# Patient Record
Sex: Male | Born: 1970 | Race: White | Hispanic: No | Marital: Married | Smoking: Current every day smoker
Health system: Southern US, Community
[De-identification: ages and names within clinical notes are randomized; demographics above are authoritative.]

## PROBLEM LIST (undated history)

## (undated) ENCOUNTER — Emergency Department: Discharge status: Absent without leave | Payer: Self-pay | Source: Home / Self Care

## (undated) DIAGNOSIS — E785 Hyperlipidemia, unspecified: Secondary | ICD-10-CM

## (undated) DIAGNOSIS — F419 Anxiety disorder, unspecified: Secondary | ICD-10-CM

## (undated) DIAGNOSIS — K2960 Other gastritis without bleeding: Secondary | ICD-10-CM

## (undated) DIAGNOSIS — G4700 Insomnia, unspecified: Secondary | ICD-10-CM

## (undated) HISTORY — PX: BACK SURGERY: SHX140

---

## 2011-05-05 ENCOUNTER — Emergency Department
Admission: EM | Admit: 2011-05-05 | Discharge: 2011-05-05 | Disposition: A | Discharge status: Home / Self Care | Payer: BC Managed Care – PPO | Source: Home / Self Care | Attending: Family Medicine | Admitting: Family Medicine

## 2011-05-05 DIAGNOSIS — J069 Acute upper respiratory infection, unspecified: Secondary | ICD-10-CM

## 2011-05-05 HISTORY — DX: Anxiety disorder, unspecified: F41.9

## 2011-05-05 HISTORY — DX: Hyperlipidemia, unspecified: E78.5

## 2011-05-05 MED ORDER — BENZONATATE 200 MG PO CAPS: 200.0000 mg | ORAL_CAPSULE | Freq: Every day | ORAL | Status: AC

## 2011-05-05 MED ORDER — CEFDINIR 300 MG PO CAPS: 300.0000 mg | ORAL_CAPSULE | Freq: Two times a day (BID) | ORAL | Status: AC

## 2011-05-05 NOTE — ED Provider Notes (Signed)
History     CSN: [id number] Arrival date & time: 05/05/2011  5:09 PM   First MD Initiated Contact with Patient 05/05/11 1723      Chief Complaint  Patient presents with  . Cough      HPI Comments: Patient complains of URI symptoms that started 3 days ago, now becoming worse with wheezing.  He smokes 1.5 pack per day   Patient is a 40 y.o. male presenting with cough.  Cough The current episode started more than 2 days ago. The problem occurs hourly. The problem has been gradually worsening. The cough is non-productive. There has been no fever. Associated symptoms include ear congestion, ear pain, rhinorrhea, sore throat and wheezing. Pertinent negatives include no chest pain, no chills, no sweats, no headaches, no myalgias, no shortness of breath and no eye redness. He has tried nothing for the symptoms. He is a smoker. His past medical history is significant for pneumonia.    Past Medical History  Diagnosis Date  . Anxiety   . Hyperlipidemia     History reviewed. No pertinent past surgical history.  History reviewed. No pertinent family history.  History  Substance Use Topics  . Smoking status: Current Everyday Smoker  . Smokeless tobacco: Not on file  . Alcohol Use: No      Review of Systems  Constitutional: Positive for fatigue. Negative for fever and chills.  HENT: Positive for ear pain, congestion, sore throat, rhinorrhea and voice change. Negative for trouble swallowing.   Eyes: Negative for redness.  Respiratory: Positive for cough and wheezing. Negative for chest tightness and shortness of breath.   Cardiovascular: Negative for chest pain.  Gastrointestinal: Negative.   Genitourinary: Negative.   Musculoskeletal: Negative.  Negative for myalgias.  Skin: Negative.   Neurological: Negative for headaches.    Allergies  Review of patient's allergies indicates no known allergies.  Home Medications   Current Outpatient Rx  Name Route Sig Dispense Refill  .  ALPRAZOLAM 1 MG PO TABS Oral Take 1 mg by mouth at bedtime as needed.      Marland Kitchen SIMVASTATIN 40 MG PO TABS Oral Take 40 mg by mouth at bedtime.      Marland Kitchen BENZONATATE 200 MG PO CAPS Oral Take 1 capsule (200 mg total) by mouth at bedtime. Take as needed for cough 12 capsule 0  . CEFDINIR 300 MG PO CAPS Oral Take 1 capsule (300 mg total) by mouth 2 (two) times daily. 20 capsule 0    BP 119/78  Pulse 72  Temp(Src) 98.3 F (36.8 C) (Oral)  Resp 18  Ht 5\' 10"  (1.778 m)  Wt 191 lb 8 oz (86.864 kg)  BMI 27.48 kg/m2  SpO2 98%  Physical Exam  Nursing note and vitals reviewed. Constitutional: He is oriented to person, place, and time. He appears well-developed and well-nourished. No distress.  HENT:  Head: Normocephalic and atraumatic.  Right Ear: Tympanic membrane and external ear normal.  Left Ear: Tympanic membrane and external ear normal.  Nose: Nose normal.  Mouth/Throat: Oropharynx is clear and moist. No oropharyngeal exudate.  Eyes: Conjunctivae are normal. Pupils are equal, round, and reactive to light. Right eye exhibits no discharge. Left eye exhibits no discharge. No scleral icterus.  Neck: Neck supple.  Cardiovascular: Normal rate, regular rhythm and normal heart sounds.   Pulmonary/Chest: No respiratory distress. He has no wheezes. He has rhonchi. He has no rales. He exhibits no tenderness.  Abdominal: Soft. There is no tenderness.  Musculoskeletal: He exhibits  no edema and no tenderness.  Lymphadenopathy:    He has no cervical adenopathy.  Neurological: He is alert and oriented to person, place, and time.  Skin: Skin is warm and dry.    ED Course  Procedures none      1. Acute upper respiratory infections of unspecified site       MDM  With history of pneumonia, will begin Omnicef and Tessalon at bedtime. Take Mucinex D (guaifenesin with decongestant) twice daily for congestion.  Increase fluid intake, rest. May use Afrin nasal spray (or generic oxymetazoline) twice  daily for about 5 days.  Also recommend using saline nasal spray several times daily and/or saline nasal irrigation. Stop all antihistamines for now, and other non-prescription cough/cold preparations. May take Ibuprofen 200mg , 4 tabs every 8 hours with food for body aches, etc. Follow-up with family doctor if not improving one week.         Donna Christen, MD 05/07/11 2241

## 2011-05-05 NOTE — ED Notes (Signed)
Dry cough, tightness in chest started on Friday

## 2011-09-17 ENCOUNTER — Encounter: Payer: Self-pay | Admitting: Emergency Medicine

## 2011-09-17 ENCOUNTER — Emergency Department
Admission: EM | Admit: 2011-09-17 | Discharge: 2011-09-17 | Disposition: A | Discharge status: Home / Self Care | Payer: BC Managed Care – PPO | Source: Home / Self Care | Attending: Emergency Medicine | Admitting: Emergency Medicine

## 2011-09-17 DIAGNOSIS — J069 Acute upper respiratory infection, unspecified: Secondary | ICD-10-CM

## 2011-09-17 MED ORDER — TRAMADOL HCL 50 MG PO TABS: 50.0000 mg | ORAL_TABLET | Freq: Four times a day (QID) | ORAL | Status: AC | PRN

## 2011-09-17 MED ORDER — AMOXICILLIN-POT CLAVULANATE 875-125 MG PO TABS: 1.0000 | ORAL_TABLET | Freq: Two times a day (BID) | ORAL | Status: AC

## 2011-09-17 MED ORDER — METHYLPREDNISOLONE SODIUM SUCC 125 MG IJ SOLR
125.0000 mg | Freq: Once | INTRAMUSCULAR | Status: AC
  Administered 2011-09-17: 125 mg via INTRAMUSCULAR

## 2011-09-17 NOTE — ED Notes (Signed)
Sinus headache, pain behind the eyes, ear pain x 3 weeks

## 2011-09-17 NOTE — ED Provider Notes (Signed)
History     CSN: [id number]  Arrival date & time 09/17/11  1514   First MD Initiated Contact with Patient 09/17/11 1518      Chief Complaint  Patient presents with  . Sinusitis    (Consider location/radiation/quality/duration/timing/severity/associated sxs/prior treatment) HPI Ronald Chavez is a 41 y.o. male who complains of onset of cold symptoms for 3 weeks.  The symptoms are constant and mild-moderate in severity.  No known seasonal allergies.  He has been taking OTC sinus and cold medicines which don't seem to be helping. + sore throat + cough No pleuritic pain No wheezing + nasal congestion + post-nasal drainage ++ sinus pain/pressure No chest congestion No itchy/red eyes + earache No hemoptysis No SOB No chills/sweats No fever No nausea No vomiting No abdominal pain No diarrhea No skin rashes No fatigue No myalgias + frontal headache    Past Medical History  Diagnosis Date  . Anxiety   . Hyperlipidemia   . Hyperlipidemia     History reviewed. No pertinent past surgical history.  Family History  Problem Relation Age of Onset  . Rheum arthritis Father     History  Substance Use Topics  . Smoking status: Current Everyday Smoker -- 25 years  . Smokeless tobacco: Not on file  . Alcohol Use: No      Review of Systems  All other systems reviewed and are negative.    Allergies  Review of patient's allergies indicates not on file.  Home Medications   Current Outpatient Rx  Name Route Sig Dispense Refill  . ALPRAZOLAM 1 MG PO TABS Oral Take 1 mg by mouth at bedtime as needed.      . AMOXICILLIN-POT CLAVULANATE 875-125 MG PO TABS Oral Take 1 tablet by mouth 2 (two) times daily. 20 tablet 0  . SIMVASTATIN 40 MG PO TABS Oral Take 40 mg by mouth at bedtime.      . TRAMADOL HCL 50 MG PO TABS Oral Take 1 tablet (50 mg total) by mouth every 6 (six) hours as needed for pain. 20 tablet 0    BP 127/77  Pulse 84  Temp(Src) 98.7 F (37.1 C) (Oral)  Resp  16  Ht 5\' 9"  (1.753 m)  Wt 189 lb (85.73 kg)  BMI 27.91 kg/m2  SpO2 97%  Physical Exam  Nursing note and vitals reviewed. Constitutional: He is oriented to person, place, and time. He appears well-developed and well-nourished.  HENT:  Head: Normocephalic and atraumatic.  Right Ear: Tympanic membrane, external ear and ear canal normal.  Left Ear: Tympanic membrane, external ear and ear canal normal.  Nose: Mucosal edema (raw) and rhinorrhea present. Right sinus exhibits frontal sinus tenderness. Left sinus exhibits frontal sinus tenderness.  Mouth/Throat: Posterior oropharyngeal edema (tonsillar swelling 2+ bilateral) and posterior oropharyngeal erythema present. No oropharyngeal exudate.  Eyes: No scleral icterus.  Neck: Neck supple.  Cardiovascular: Regular rhythm and normal heart sounds.   Pulmonary/Chest: Effort normal and breath sounds normal. No respiratory distress. He has no decreased breath sounds. He has no wheezes. He has no rhonchi.  Neurological: He is alert and oriented to person, place, and time.  Skin: Skin is warm and dry. No rash noted.  Psychiatric: He has a normal mood and affect. His speech is normal.    ED Course  Procedures (including critical care time)  Labs Reviewed - No data to display No results found.   1. Acute upper respiratory infections of unspecified site       MDM  1)  Take the prescribed antibiotic as instructed.  He declined Prednisone, but asked for shot [of Solumedrol].  Next step would be to send to ENT vs start on Flonase.  2)  Use nasal saline solution (over the counter) at least 3 times a day. 3)  Use over the counter decongestants like Zyrtec-D every 12 hours as needed to help with congestion.  If you have hypertension, do not take medicines with sudafed.  He reports that he doesn't want to take Sudafed because it keeps him up at night. 4)  Can take tylenol every 6 hours or motrin every 8 hours for pain or fever. 5)  Follow up with  your primary doctor if no improvement in 5-7 days, sooner if increasing pain, fever, or new symptoms.     Marlaine Hind, MD 09/17/11 1550

## 2012-01-21 ENCOUNTER — Emergency Department
Admission: EM | Admit: 2012-01-21 | Discharge: 2012-01-21 | Disposition: A | Discharge status: Home / Self Care | Payer: BC Managed Care – PPO | Source: Home / Self Care | Attending: Family Medicine | Admitting: Family Medicine

## 2012-01-21 ENCOUNTER — Encounter: Payer: Self-pay | Admitting: *Deleted

## 2012-01-21 DIAGNOSIS — J209 Acute bronchitis, unspecified: Secondary | ICD-10-CM

## 2012-01-21 MED ORDER — HYDROCOD POLST-CHLORPHEN POLST 10-8 MG/5ML PO LQCR: 5.0000 mL | Freq: Every evening | ORAL | Status: DC | PRN

## 2012-01-21 MED ORDER — PSEUDOEPHEDRINE-GUAIFENESIN ER 60-600 MG PO TB12: 1.0000 | ORAL_TABLET | Freq: Two times a day (BID) | ORAL | Status: DC

## 2012-01-21 MED ORDER — CEFDINIR 300 MG PO CAPS: 300.0000 mg | ORAL_CAPSULE | Freq: Two times a day (BID) | ORAL | Status: AC

## 2012-01-21 NOTE — ED Provider Notes (Signed)
History     CSN: [id number]  Arrival date & time 01/21/12  1514   First MD Initiated Contact with Patient 01/21/12 1534      Chief Complaint  Patient presents with  . Nasal Congestion  . Otalgia      HPI Comments: Patient complains of approximately 2.5 week history of gradually progressive URI symptoms beginning with a mild sore throat (now persistent), followed by progressive nasal congestion.  A cough started next and has persisted.  Complains of fatigue and initial myalgias.  Cough is now worse at night and generally non-productive during the day.  There has been no pleuritic pain, but he does have shortness of breath and wheezing with activity.  He notes that both ears feel clogged.  He has had pneumonia in the past. He continues to smoke.  The history is provided by the patient.    Past Medical History  Diagnosis Date  . Anxiety   . Hyperlipidemia   . Hyperlipidemia     History reviewed. No pertinent past surgical history.  Family History  Problem Relation Age of Onset  . Rheum arthritis Father     History  Substance Use Topics  . Smoking status: Current Everyday Smoker -- 25 years  . Smokeless tobacco: Not on file  . Alcohol Use: No      Review of Systems + sore throat + cough No pleuritic pain + wheezing + nasal congestion + post-nasal drainage ? sinus pain/pressure No itchy/red eyes ? earache No hemoptysis + SOB with activity No fever, + chills No nausea No vomiting No abdominal pain No diarrhea No urinary symptoms No skin rashes + fatigue No myalgias No headache Used OTC meds without relief  Allergies  Review of patient's allergies indicates no known allergies.  Home Medications   Current Outpatient Rx  Name Route Sig Dispense Refill  . ATORVASTATIN CALCIUM 40 MG PO TABS Oral Take 40 mg by mouth daily.    Marland Kitchen ALPRAZOLAM 1 MG PO TABS Oral Take 1 mg by mouth at bedtime as needed.      Marland Kitchen CEFDINIR 300 MG PO CAPS Oral Take 1 capsule (300  mg total) by mouth 2 (two) times daily. 20 capsule 0  . HYDROCOD POLST-CPM POLST ER 10-8 MG/5ML PO LQCR Oral Take 5 mLs by mouth at bedtime as needed. 115 mL 0  . PSEUDOEPHEDRINE-GUAIFENESIN ER 60-600 MG PO TB12 Oral Take 1 tablet by mouth every 12 (twelve) hours. For cough and sinus congestion 20 tablet 0  . SIMVASTATIN 40 MG PO TABS Oral Take 40 mg by mouth at bedtime.        BP 128/83  Pulse 81  Temp 98.3 F (36.8 C) (Oral)  Resp 16  Ht 5\' 10"  (1.778 m)  Wt 185 lb (83.915 kg)  BMI 26.54 kg/m2  SpO2 98%  Physical Exam Nursing notes and Vital Signs reviewed. Appearance:  Patient appears healthy, stated age, and in no acute distress Eyes:  Pupils are equal, round, and reactive to light and accomodation.  Extraocular movement is intact.  Conjunctivae are not inflamed  Ears:  Canals normal.  Tympanic membranes normal.  Nose:  Mildly congested turbinates.  No sinus tenderness.   Pharynx:  Normal Neck:  Supple.  No adenopathy Lungs:  Clear to auscultation.  Breath sounds are equal.  Heart:  Regular rate and rhythm without murmurs, rubs, or gallops.  Abdomen:  Nontender without masses or hepatosplenomegaly.  Bowel sounds are present.  No CVA or flank tenderness.  Extremities:  No edema.  No calf tenderness Skin:  No rash present.   ED Course  Procedures none      1. Acute bronchitis       MDM  Begin Omnicef.  Mucinex D.  Rx for Tussionex at bedtime. May use Afrin nasal spray (or generic oxymetazoline) twice daily for about 5 days.  Also recommend using saline nasal spray several times daily and saline nasal irrigation (AYR is a common brand) Stop all antihistamines for now, and other non-prescription cough/cold preparations. Follow-up with family doctor if not improving 7 to 10 days.  Urged to discontinue smoking.        Lattie Haw, MD 01/21/12 [phone]

## 2012-01-21 NOTE — ED Notes (Signed)
Pt c/o nasal congestion, bilateral ear ache, and productive cough x 2 wks. Pt c/o fever at onset of s/s. Pt reports that he took tylenol 4 tabs 1 hour ago.

## 2012-02-05 ENCOUNTER — Ambulatory Visit (INDEPENDENT_AMBULATORY_CARE_PROVIDER_SITE_OTHER): Payer: BC Managed Care – PPO | Admitting: Psychology

## 2012-02-05 ENCOUNTER — Encounter (HOSPITAL_COMMUNITY): Payer: Self-pay | Admitting: Psychology

## 2012-02-05 DIAGNOSIS — F329 Major depressive disorder, single episode, unspecified: Secondary | ICD-10-CM

## 2012-02-05 DIAGNOSIS — F411 Generalized anxiety disorder: Secondary | ICD-10-CM

## 2012-02-05 DIAGNOSIS — F32A Depression, unspecified: Secondary | ICD-10-CM | POA: Insufficient documentation

## 2012-02-05 DIAGNOSIS — F3289 Other specified depressive episodes: Secondary | ICD-10-CM
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
## 2012-02-05 NOTE — Patient Instructions (Signed)
1- Ask family doctor to complete FMLA paperwork. If he will not complete bring paperwork next visit with Olegario Messier. 2- Tell your boss that you have a medical appointment on Monday, Sept 23rd at 2 pm and ask to leave work by 145pm to get to your appointment.  If you need assistance to get the time off ask for help with HR. 3- Come prepared to discuss goals for therapy.  If the FMLA paperwork was not completed by your primary care doctor bring it with you and we will complete in session.

## 2012-02-09 ENCOUNTER — Ambulatory Visit (INDEPENDENT_AMBULATORY_CARE_PROVIDER_SITE_OTHER): Payer: BC Managed Care – PPO | Admitting: Psychology

## 2012-02-09 ENCOUNTER — Encounter (HOSPITAL_COMMUNITY): Payer: Self-pay | Admitting: Psychology

## 2012-02-09 DIAGNOSIS — F3289 Other specified depressive episodes: Secondary | ICD-10-CM

## 2012-02-09 DIAGNOSIS — F411 Generalized anxiety disorder: Secondary | ICD-10-CM

## 2012-02-09 DIAGNOSIS — F32A Depression, unspecified: Secondary | ICD-10-CM

## 2012-02-09 DIAGNOSIS — F329 Major depressive disorder, single episode, unspecified: Secondary | ICD-10-CM

## 2012-02-09 NOTE — Progress Notes (Signed)
   THERAPIST PROGRESS NOTE  Session Time: 212- 248 pm  Participation Level: Active  Behavioral Response: CasualAlertEuthymic  Type of Therapy: Individual Therapy  Treatment Goals addressed: Anxiety and Coping  Interventions: Solution Focused, Strength-based, Psychosocial Skills: goal setting, coping and Supportive  Summary: Ronald Chavez is a 41 y.o. male who presents as pleasant and easily engaged.  His attempt to get his FMLA paperwork completed by his MD did not work and he brings the paperwork back in for me to complete.  The patient reports he had an okay week.  He talked about some of the struggles he has with watching some coworkers get away with doing less than him and how this frustrates him.  He has been able to make it to work most days in the past week.  This counselor and the patient completed goals for treatment.  The FMLA paperwork was completed during this visit as well.  Suicidal/Homicidal: No  Plan: Return again in 1 week.  Diagnosis: Axis I: Depressive Disorder NOS and Generalized Anxiety Disorder    Axis II: No diagnosis    Salley Scarlet, [facility] 02/09/2012

## 2012-02-18 ENCOUNTER — Ambulatory Visit (INDEPENDENT_AMBULATORY_CARE_PROVIDER_SITE_OTHER): Payer: BC Managed Care – PPO | Admitting: Psychology

## 2012-02-18 DIAGNOSIS — F411 Generalized anxiety disorder: Secondary | ICD-10-CM

## 2012-02-18 DIAGNOSIS — F3289 Other specified depressive episodes: Secondary | ICD-10-CM

## 2012-02-18 DIAGNOSIS — F32A Depression, unspecified: Secondary | ICD-10-CM

## 2012-02-18 DIAGNOSIS — F329 Major depressive disorder, single episode, unspecified: Secondary | ICD-10-CM

## 2012-02-24 ENCOUNTER — Ambulatory Visit (HOSPITAL_COMMUNITY): Payer: Self-pay | Admitting: Psychology

## 2012-02-29 ENCOUNTER — Encounter (HOSPITAL_COMMUNITY): Payer: Self-pay | Admitting: Psychology

## 2012-02-29 NOTE — Progress Notes (Signed)
   THERAPIST PROGRESS NOTE  Session Time: 314- 350 pm  Participation Level: Active  Behavioral Response: NeatAlertEuthymic  Type of Therapy: Individual Therapy  Treatment Goals addressed: Anxiety, Communication: in relationships and Coping  Interventions: Solution Focused, Strength-based, Psychosocial Skills: coping and Supportive  Summary: Ronald Chavez is a 41 y.o. male who presents late for his appointment (he called to inform); he is pleasant and easily engaged.  He is doing better than our previous visit.  He assisted a co-worker with whom he is easily annoyed when he completed his work.  He felt good about having a positive interaction with him and can see how this will be helpful going forward.  He has not missed any work since handing in his FMLA paperwork but has the ability to be away from work without an issue since this occurred.  He is feeling stressed by moving to a new place but recognizes this as a good stress.  He is excited that his girlfriend will be living with he and his son but admits that moving is stressful.  He has had no interactions with his estranged wife and doesn't even know her whereabouts.   Suicidal/Homicidal: No  Plan: Return again in 2-3 weeks.  Diagnosis: Axis I: Depressive Disorder NOS and Generalized Anxiety Disorder    Axis II: No diagnosis    Salley Scarlet, [facility] 02/29/2012

## 2012-03-03 ENCOUNTER — Ambulatory Visit (HOSPITAL_COMMUNITY): Payer: Self-pay | Admitting: Psychology

## 2012-03-16 ENCOUNTER — Ambulatory Visit (HOSPITAL_COMMUNITY): Payer: Self-pay | Admitting: Psychology

## 2012-08-08 ENCOUNTER — Emergency Department (INDEPENDENT_AMBULATORY_CARE_PROVIDER_SITE_OTHER): Payer: BC Managed Care – PPO

## 2012-08-08 ENCOUNTER — Emergency Department (INDEPENDENT_AMBULATORY_CARE_PROVIDER_SITE_OTHER)
Admission: EM | Admit: 2012-08-08 | Discharge: 2012-08-08 | Disposition: A | Discharge status: Home / Self Care | Payer: BC Managed Care – PPO | Source: Home / Self Care | Attending: Emergency Medicine | Admitting: Emergency Medicine

## 2012-08-08 DIAGNOSIS — M25519 Pain in unspecified shoulder: Secondary | ICD-10-CM

## 2012-08-08 DIAGNOSIS — S4980XA Other specified injuries of shoulder and upper arm, unspecified arm, initial encounter: Secondary | ICD-10-CM

## 2012-08-08 DIAGNOSIS — M25512 Pain in left shoulder: Secondary | ICD-10-CM

## 2012-08-08 MED ORDER — HYDROCODONE-ACETAMINOPHEN 5-325 MG PO TABS: 2.0000 | ORAL_TABLET | Freq: Four times a day (QID) | ORAL | Status: DC | PRN
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
## 2012-08-09 ENCOUNTER — Telehealth: Payer: Self-pay | Admitting: Emergency Medicine

## 2013-12-18 ENCOUNTER — Emergency Department (INDEPENDENT_AMBULATORY_CARE_PROVIDER_SITE_OTHER)
Admission: EM | Admit: 2013-12-18 | Discharge: 2013-12-18 | Disposition: A | Discharge status: Home / Self Care | Payer: BC Managed Care – PPO | Source: Home / Self Care | Attending: Family Medicine | Admitting: Family Medicine

## 2013-12-18 ENCOUNTER — Encounter: Payer: Self-pay | Admitting: Emergency Medicine

## 2013-12-18 ENCOUNTER — Emergency Department (INDEPENDENT_AMBULATORY_CARE_PROVIDER_SITE_OTHER): Payer: BC Managed Care – PPO

## 2013-12-18 DIAGNOSIS — Z87891 Personal history of nicotine dependence: Secondary | ICD-10-CM

## 2013-12-18 DIAGNOSIS — J209 Acute bronchitis, unspecified: Secondary | ICD-10-CM

## 2013-12-18 DIAGNOSIS — R059 Cough, unspecified: Secondary | ICD-10-CM

## 2013-12-18 DIAGNOSIS — R05 Cough: Secondary | ICD-10-CM

## 2013-12-18 HISTORY — DX: Other gastritis without bleeding: K29.60

## 2013-12-18 LAB — POCT CBC W AUTO DIFF (K'VILLE URGENT CARE)

## 2013-12-18 MED ORDER — FLUTICASONE-SALMETEROL 100-50 MCG/DOSE IN AEPB: 1.0000 | INHALATION_SPRAY | Freq: Two times a day (BID) | RESPIRATORY_TRACT | Status: DC

## 2013-12-18 MED ORDER — GUAIFENESIN ER 600 MG PO TB12: ORAL_TABLET | ORAL | Status: DC

## 2013-12-18 MED ORDER — IPRATROPIUM-ALBUTEROL 0.5-2.5 (3) MG/3ML IN SOLN
3.0000 mL | Freq: Once | RESPIRATORY_TRACT | Status: AC
  Administered 2013-12-18: 3 mL via RESPIRATORY_TRACT

## 2013-12-18 MED ORDER — ALBUTEROL SULFATE HFA 108 (90 BASE) MCG/ACT IN AERS: 2.0000 | INHALATION_SPRAY | RESPIRATORY_TRACT | Status: DC | PRN

## 2013-12-18 MED ORDER — BENZONATATE 200 MG PO CAPS
200.0000 mg | ORAL_CAPSULE | Freq: Every day | ORAL | Status: DC
Start: 2013-12-18 — End: 2014-06-13

## 2013-12-18 MED ORDER — IBUPROFEN 800 MG PO TABS: 800.0000 mg | ORAL_TABLET | Freq: Three times a day (TID) | ORAL | Status: DC

## 2013-12-18 MED ORDER — AZITHROMYCIN 250 MG PO TABS: ORAL_TABLET | ORAL | Status: DC

## 2013-12-18 NOTE — ED Provider Notes (Signed)
CSN: [id number]     Arrival date & time 12/18/13  1150 History   First MD Initiated Contact with Patient 12/18/13 1235     Chief Complaint  Patient presents with  . Shortness of Breath      HPI Comments: Patient complains of onset of cough and nasal congestion about one week ago.  The cough is non-productive and worse at night.  He has had shortness of breath and wheezing.  Over the past 2 to 3 days he has had chills. He smokes 1 pack per day and had pneumonia in 2009  The history is provided by the patient.    Past Medical History  Diagnosis Date  . Anxiety   . Hyperlipidemia   . Hyperlipidemia   . Reflux gastritis    Past Surgical History  Procedure Laterality Date  . Back surgery     Family History  Problem Relation Age of Onset  . Rheum arthritis Father    History  Substance Use Topics  . Smoking status: Current Every Day Smoker -- 1.00 packs/day for 25 years    Types: Cigarettes  . Smokeless tobacco: Not on file  . Alcohol Use: No    Review of Systems No sore throat + cough No pleuritic pain + wheezing + nasal congestion + post-nasal drainage No sinus pain/pressure No itchy/red eyes ? earache No hemoptysis + SOB No fever, + chills No nausea No vomiting No abdominal pain No diarrhea No urinary symptoms No skin rash + fatigue No myalgias + headache Used OTC meds without relief  Allergies  Review of patient's allergies indicates no known allergies.  Home Medications   Prior to Admission medications   Medication Sig Start Date End Date Taking? Authorizing Provider  albuterol (PROVENTIL HFA;VENTOLIN HFA) 108 (90 BASE) MCG/ACT inhaler Inhale 2 puffs into the lungs every 4 (four) hours as needed. 12/18/13   Lattie Haw, MD  ALPRAZolam Prudy Feeler) 1 MG tablet Take 1 mg by mouth 3 (three) times daily as needed.     Historical Provider, MD  atorvastatin (LIPITOR) 40 MG tablet Take 40 mg by mouth daily.    Historical Provider, MD  azithromycin (ZITHROMAX  Z-PAK) 250 MG tablet Take 2 tabs today; then begin one tab once daily for 4 more days. 12/18/13   Lattie Haw, MD  benzonatate (TESSALON) 200 MG capsule Take 1 capsule (200 mg total) by mouth at bedtime. Take as needed for cough 12/18/13   Lattie Haw, MD  Fluticasone-Salmeterol (ADVAIR DISKUS) 100-50 MCG/DOSE AEPB Inhale 1 puff into the lungs 2 (two) times daily. 12/18/13   Lattie Haw, MD  sertraline (ZOLOFT) 50 MG tablet Take 50 mg by mouth daily.    Historical Provider, MD   BP 122/80  Pulse 110  Temp(Src) 99.7 F (37.6 C) (Oral)  Ht 5\' 10"  (1.778 m)  Wt 200 lb 8 oz (90.946 kg)  BMI 28.77 kg/m2 Physical Exam Nursing notes and Vital Signs reviewed. Appearance:  Patient appears healthy, stated age, and in no acute distress Eyes:  Pupils are equal, round, and reactive to light and accomodation.  Extraocular movement is intact.  Conjunctivae are not inflamed  Ears:  Canals normal.  Tympanic membranes normal.  Nose:  Mildly congested turbinates.  No sinus tenderness.   Pharynx:  Normal Neck:  Supple.   Enlarged posterior nodes are palpated bilaterally  Lungs:  Bilateral wheezes, more prominent on the left. Breath sounds are equal.  Chest:  Distinct tenderness to palpation over the  mid-sternum.  Heart:  Regular rate and rhythm without murmurs, rubs, or gallops.  Rate 104 Abdomen:  Nontender without masses or hepatosplenomegaly.  Bowel sounds are present.  No CVA or flank tenderness.  Extremities:  No edema.  No calf tenderness Skin:  No rash present.   ED Course  Procedures  none    Labs Reviewed  POCT CBC W AUTO DIFF ([facility]):  WBC 10.0; LY 10.9; MO 2.9; GR 86.2; Hgb 14.7; Platelets 158     Imaging Review Dg Chest 2 View  12/18/2013   CLINICAL DATA:  Cough, smoker  EXAM: CHEST  2 VIEW  COMPARISON:  None.  FINDINGS: The heart size and mediastinal contours are within normal limits. Both lungs are clear. The visualized skeletal structures are unremarkable. The hila  are prominent bilaterally with tapering that may be seen with pulmonary arterial hypertension but is nonspecific. Diffusely prominent interstitial reticular opacities are noted.  IMPRESSION: Nonspecific prominent interstitial opacities which may be seen with the history of smoking but is nonspecific.   Electronically Signed   By: Christiana PellantGretchen  Green M.D.   On: 12/18/2013 13:25     MDM   1. Bronchospasm with bronchitis, acute    Administered DuoNeb by nebulizer, with improvement in symptoms. Begin Z-pack for atypical coverage.  Begin Advair and albuterol for rescue. Prescription written for Benzonatate [facility](Tessalon) to take at bedtime for night-time cough.  Take plain Mucinex (1200 mg guaifenesin) twice daily for cough and congestion.  May add Sudafed for sinus congestion.   Increase fluid intake, rest. Stop all antihistamines for now, and other non-prescription cough/cold preparations. May take Ibuprofen 200mg , 4 tabs every 8 hours with food for body aches, fever, etc.   Follow-up with family doctor if not improving 7 to 10 days.     Lattie HawStephen A Pietro Bonura, MD 12/21/13 1318

## 2013-12-18 NOTE — ED Notes (Signed)
Cough began 1 wk ago and has worsened.  Non productive. Puffy in periorbital regions ou.  Cannot breathe well when lying down.

## 2013-12-18 NOTE — Discharge Instructions (Signed)
Take plain Mucinex (1200 mg guaifenesin) twice daily for cough and congestion.  May add Sudafed for sinus congestion.   Increase fluid intake, rest. Stop all antihistamines for now, and other non-prescription cough/cold preparations. May take Ibuprofen 200mg , 4 tabs every 8 hours with food for body aches, fever, etc.   Follow-up with family doctor if not improving 7 to 10 days.

## 2014-05-23 ENCOUNTER — Emergency Department
Admission: EM | Admit: 2014-05-23 | Discharge: 2014-05-23 | Disposition: A | Discharge status: Home / Self Care | Payer: Self-pay | Source: Home / Self Care | Attending: Emergency Medicine | Admitting: Emergency Medicine

## 2014-05-23 ENCOUNTER — Encounter: Payer: Self-pay | Admitting: *Deleted

## 2014-05-23 ENCOUNTER — Emergency Department (INDEPENDENT_AMBULATORY_CARE_PROVIDER_SITE_OTHER): Payer: PRIVATE HEALTH INSURANCE

## 2014-05-23 DIAGNOSIS — S60012A Contusion of left thumb without damage to nail, initial encounter: Secondary | ICD-10-CM | POA: Diagnosis not present

## 2014-05-23 DIAGNOSIS — M519 Unspecified thoracic, thoracolumbar and lumbosacral intervertebral disc disorder: Secondary | ICD-10-CM

## 2014-05-23 DIAGNOSIS — M79645 Pain in left finger(s): Secondary | ICD-10-CM

## 2014-05-23 DIAGNOSIS — S39012A Strain of muscle, fascia and tendon of lower back, initial encounter: Secondary | ICD-10-CM

## 2014-05-23 HISTORY — DX: Insomnia, unspecified: G47.00

## 2014-05-23 MED ORDER — IBUPROFEN 800 MG PO TABS: 800.0000 mg | ORAL_TABLET | Freq: Three times a day (TID) | ORAL | Status: DC

## 2014-05-23 MED ORDER — CYCLOBENZAPRINE HCL 10 MG PO TABS: 10.0000 mg | ORAL_TABLET | Freq: Two times a day (BID) | ORAL | Status: DC | PRN

## 2014-05-23 MED ORDER — HYDROCODONE-ACETAMINOPHEN 5-325 MG PO TABS: 2.0000 | ORAL_TABLET | ORAL | Status: DC | PRN
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
## 2014-06-13 ENCOUNTER — Encounter: Payer: Self-pay | Admitting: Sports Medicine

## 2014-06-13 ENCOUNTER — Ambulatory Visit (INDEPENDENT_AMBULATORY_CARE_PROVIDER_SITE_OTHER): Payer: BLUE CROSS/BLUE SHIELD | Admitting: Sports Medicine

## 2014-06-13 VITALS — BP 117/77 | HR 94 | Ht 70.0 in | Wt 201.0 lb

## 2014-06-13 DIAGNOSIS — L918 Other hypertrophic disorders of the skin: Secondary | ICD-10-CM | POA: Diagnosis not present

## 2014-06-13 DIAGNOSIS — M51369 Other intervertebral disc degeneration, lumbar region without mention of lumbar back pain or lower extremity pain: Secondary | ICD-10-CM | POA: Insufficient documentation

## 2014-06-13 DIAGNOSIS — S63617A Unspecified sprain of left little finger, initial encounter: Secondary | ICD-10-CM

## 2014-06-13 DIAGNOSIS — M5136 Other intervertebral disc degeneration, lumbar region: Secondary | ICD-10-CM | POA: Diagnosis not present

## 2014-06-13 MED ORDER — MELOXICAM 15 MG PO TABS: ORAL_TABLET | ORAL | Status: DC

## 2014-06-13 MED ORDER — DEXAMETHASONE 4 MG PO TABS: 4.0000 mg | ORAL_TABLET | Freq: Two times a day (BID) | ORAL | Status: DC

## 2014-06-13 NOTE — Assessment & Plan Note (Signed)
Decadron, formal physical therapy, meloxicam. Return in one month, MRI for intervention if no better. Left sided posterior thigh radicular symptoms
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
## 2014-06-13 NOTE — Assessment & Plan Note (Signed)
Fourth and fifth fingers were buddy taped together. Return in a couple of weeks for this.  X-rays were negative.
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
## 2014-06-19 ENCOUNTER — Ambulatory Visit (INDEPENDENT_AMBULATORY_CARE_PROVIDER_SITE_OTHER): Payer: BLUE CROSS/BLUE SHIELD | Admitting: Family Medicine

## 2014-06-19 ENCOUNTER — Encounter: Payer: Self-pay | Admitting: Family Medicine

## 2014-06-19 VITALS — BP 138/79 | HR 87 | Ht 70.0 in | Wt 201.0 lb

## 2014-06-19 DIAGNOSIS — M5442 Lumbago with sciatica, left side: Secondary | ICD-10-CM | POA: Diagnosis not present

## 2014-06-19 DIAGNOSIS — E785 Hyperlipidemia, unspecified: Secondary | ICD-10-CM | POA: Insufficient documentation

## 2014-06-19 DIAGNOSIS — K429 Umbilical hernia without obstruction or gangrene: Secondary | ICD-10-CM | POA: Insufficient documentation

## 2014-06-19 DIAGNOSIS — Z0000 Encounter for general adult medical examination without abnormal findings: Secondary | ICD-10-CM | POA: Diagnosis not present

## 2014-06-19 MED ORDER — GABAPENTIN 300 MG PO CAPS: 300.0000 mg | ORAL_CAPSULE | Freq: Every day | ORAL | Status: AC

## 2014-06-19 NOTE — Progress Notes (Signed)
CC: Muhammad Vacca is a 44 y.o. male is here for Establish Care   Subjective: HPI:  Colonoscopy: no current indication Prostate: Discussed screening risks/beneifts with patienttoday, we'll consider screening at age 10  Influenza Vaccine: declined Pneumovax: no current indication Td/Tdap: UTD Zoster: (Start 45 yo)  Pleasant 44 year old here to establish care requesting complete physical exam  His only complaint is chronic low back pain that radiates down the back of the left leg. Worse with flexion. He is currently waiting to hear back on physical therapy scheduling. He's been using meloxicam without much benefit of his pain. Symptoms have not changed since he saw our sports medicine clinic a few weeks ago. Denies any new motor or sensory disturbances  Review of Systems - General ROS: negative for - chills, fever, night sweats, weight gain or weight loss Ophthalmic ROS: negative for - decreased vision Psychological ROS: negative for - anxiety or depression ENT ROS: negative for - hearing change, nasal congestion, tinnitus or allergies Hematological and Lymphatic ROS: negative for - bleeding problems, bruising or swollen lymph nodes Breast ROS: negative Respiratory ROS: no cough, shortness of breath, or wheezing Cardiovascular ROS: no chest pain or dyspnea on exertion Gastrointestinal ROS: no abdominal pain, change in bowel habits, or black or bloody stools Genito-Urinary ROS: negative for - genital discharge, genital ulcers, incontinence or abnormal bleeding from genitals Musculoskeletal ROS: negative for - joint pain or muscle pain other than that described above Neurological ROS: negative for - headaches or memory loss Dermatological ROS: negative for lumps, mole changes, rash and skin lesion changes  Past Medical History  Diagnosis Date  . Anxiety   . Hyperlipidemia   . Hyperlipidemia   . Reflux gastritis   . Insomnia     Past Surgical History  Procedure Laterality Date  .  Back surgery     Family History  Problem Relation Age of Onset  . Rheum arthritis Father   . Dementia Father   . Cancer Mother     breast    History   Social History  . Marital Status: Married    Spouse Name: N/A    Number of Children: N/A  . Years of Education: N/A   Occupational History  . Not on file.   Social History Main Topics  . Smoking status: Current Every Day Smoker -- 1.50 packs/day for 25 years    Types: Cigarettes  . Smokeless tobacco: Not on file  . Alcohol Use: No  . Drug Use: No  . Sexual Activity:    Partners: Female   Other Topics Concern  . Not on file   Social History Narrative     Objective: BP 138/79 mmHg  Pulse 87  Ht  (1.778 m)  Wt 201 lb (91.173 kg)  BMI 28.84 kg/m2  General: No Acute Distress HEENT: Atraumatic, normocephalic, conjunctivae normal without scleral icterus.  No nasal discharge, hearing grossly intact, TMs with good landmarks bilaterally with no middle ear abnormalities, posterior pharynx clear without oral lesions. Neck: Supple, trachea midline, no cervical nor supraclavicular adenopathy. Pulmonary: Clear to auscultation bilaterally without wheezing, rhonchi, nor rales. Cardiac: Regular rate and rhythm.  No murmurs, rubs, nor gallops. No peripheral edema.  2+ peripheral pulses bilaterally. Abdomen: Bowel sounds normal.  No masses other than hernia about the size of a BB in the umbilicus when coughing, painless..  Non-tender without rebound.  Negative Murphy's sign. MSK: Grossly intact, no signs of weakness.  Full strength throughout upper and lower extremities.  Full ROM  in upper and lower extremities.  No midline spinal tenderness. Neuro: Gait unremarkable, CN II-XII grossly intact.  C5-C6 Reflex 2/4 Bilaterally, L4 Reflex 2/4 Bilaterally.  Cerebellar function intact. Skin: No rashes. Psych: Alert and oriented to person/place/time.  Thought process normal. No anxiety/depression. Assessment & Plan: Marcial Pacasimothy was seen  today for establish care.  Diagnoses and associated orders for this visit:  Annual physical exam - COMPLETE METABOLIC PANEL WITH GFR - Lipid panel - CBC  Hyperlipidemia  Left-sided low back pain with left-sided sciatica - gabapentin (NEURONTIN) 300 MG capsule; Take 1 capsule (300 mg total) by mouth at bedtime.  Umbilical hernia without obstruction and without gangrene    Healthy lifestyle interventions including but not limited to regular exercise, a healthy low fat diet, moderation of salt intake, the dangers of tobacco/alcohol/recreational drug use, nutrition supplementation, and accident avoidance were discussed with the patient and a handout was provided for future reference.  Discussed signs and symptoms of his umbilical hernia that would reflect worsening abdominal wall defect and pain, nausea, diarrhea, vomiting that would require emergent evaluation  Will attempt to control some of his back pain until he can follow-up with physical therapy and sports medicine, he is intolerant of Ultram, will start gabapentin at a low dose.   Return if symptoms worsen or fail to improve.

## 2014-06-20 LAB — LIPID PANEL
CHOL/HDL RATIO: 5 ratio
CHOLESTEROL: 198 mg/dL (ref 0–200)
HDL: 40 mg/dL (ref >39)
LDL Cholesterol: 122 mg/dL — ABNORMAL HIGH (ref 0–99)
TRIGLYCERIDES: 179 mg/dL — AB (ref <150)
VLDL: 36 mg/dL (ref 0–40)

## 2014-06-20 LAB — COMPLETE METABOLIC PANEL WITH GFR
ALBUMIN: 4.4 g/dL (ref 3.5–5.2)
ALK PHOS: 46 U/L (ref 39–117)
ALT: 13 U/L (ref 0–53)
AST: 16 U/L (ref 0–37)
BUN: 14 mg/dL (ref 6–23)
CALCIUM: 9.4 mg/dL (ref 8.4–10.5)
CO2: 27 mEq/L (ref 19–32)
Chloride: 105 mEq/L (ref 96–112)
Creat: 0.88 mg/dL (ref 0.50–1.35)
GFR, Est Non African American: >89 mL/min
Glucose, Bld: 77 mg/dL (ref 70–99)
POTASSIUM: 5.1 meq/L (ref 3.5–5.3)
Sodium: 139 mEq/L (ref 135–145)
Total Bilirubin: 0.3 mg/dL (ref 0.2–1.2)
Total Protein: 7.6 g/dL (ref 6.0–8.3)

## 2014-06-20 LAB — CBC
HEMATOCRIT: 40.6 % (ref 39.0–52.0)
Hemoglobin: 13.8 g/dL (ref 13.0–17.0)
MCH: 31.6 pg (ref 26.0–34.0)
MCHC: 34 g/dL (ref 30.0–36.0)
MCV: 92.9 fL (ref 78.0–100.0)
MPV: 10.4 fL (ref 8.6–12.4)
PLATELETS: 201 10*3/uL (ref 150–400)
RBC: 4.37 MIL/uL (ref 4.22–5.81)
RDW: 13.1 % (ref 11.5–15.5)
WBC: 5.3 10*3/uL (ref 4.0–10.5)

## 2014-06-21 ENCOUNTER — Ambulatory Visit (INDEPENDENT_AMBULATORY_CARE_PROVIDER_SITE_OTHER): Payer: BLUE CROSS/BLUE SHIELD | Admitting: Physical Therapy

## 2014-06-21 DIAGNOSIS — M2560 Stiffness of unspecified joint, not elsewhere classified: Secondary | ICD-10-CM

## 2014-06-21 DIAGNOSIS — M545 Low back pain: Secondary | ICD-10-CM

## 2014-06-21 DIAGNOSIS — M5136 Other intervertebral disc degeneration, lumbar region: Secondary | ICD-10-CM

## 2014-06-26 ENCOUNTER — Encounter: Payer: Self-pay | Admitting: Sports Medicine

## 2014-06-26 ENCOUNTER — Ambulatory Visit (INDEPENDENT_AMBULATORY_CARE_PROVIDER_SITE_OTHER): Payer: BLUE CROSS/BLUE SHIELD | Admitting: Sports Medicine

## 2014-06-26 VITALS — BP 120/70 | HR 107 | Temp 98.9°F | Ht 70.0 in | Wt 200.0 lb

## 2014-06-26 DIAGNOSIS — L918 Other hypertrophic disorders of the skin: Secondary | ICD-10-CM

## 2014-06-26 NOTE — Progress Notes (Signed)
  Procedure:  Removal of 6 cutaneous skin tag(s). Risks, benefits, alternatives explained to patient. Consent obtained. Time out conducted. Noted no overlying induration or erythema at site of injection. A small amount of lidocaine with epinephrine infiltrated under the skin tag(s) for local anesthesia. Hemostat used to clamp the neck of the skin tag(s). Scalpel then used to excise the skin tag(s), and subsequent electrocautery with a Hyfrecator used to control minor bleeding. Antibiotic ointment applied. Wound dressed. Advised to return if increased redness, swelling, drainage, fevers, or chills.

## 2014-06-26 NOTE — Assessment & Plan Note (Signed)
Removal of 6 cutaneous skin tags on the forehead, neck, back.

## 2014-06-30 ENCOUNTER — Encounter: Payer: BLUE CROSS/BLUE SHIELD | Admitting: Physical Therapy

## 2014-07-07 ENCOUNTER — Other Ambulatory Visit: Payer: Self-pay

## 2014-07-07 MED ORDER — MELOXICAM 15 MG PO TABS: 15.0000 mg | ORAL_TABLET | Freq: Every day | ORAL | Status: DC

## 2014-07-10 ENCOUNTER — Encounter: Payer: Self-pay | Admitting: Sports Medicine

## 2014-07-10 ENCOUNTER — Ambulatory Visit (INDEPENDENT_AMBULATORY_CARE_PROVIDER_SITE_OTHER): Payer: BLUE CROSS/BLUE SHIELD | Admitting: Sports Medicine

## 2014-07-10 VITALS — BP 114/75 | HR 61 | Ht 70.0 in | Wt 198.0 lb

## 2014-07-10 DIAGNOSIS — S63617D Unspecified sprain of left little finger, subsequent encounter: Secondary | ICD-10-CM

## 2014-07-10 DIAGNOSIS — L918 Other hypertrophic disorders of the skin: Secondary | ICD-10-CM

## 2014-07-10 NOTE — Progress Notes (Signed)
  Subjective:    CC: Follow-up  HPI: Finger injury: Persistent pain at the left fifth PIP despite immobilization for 2 weeks. X-rays initially were negative for fracture.  Skin tags: Healing well after movable at the last visit.  Past medical history, Surgical history, Family history not pertinant except as noted below, Social history, Allergies, and medications have been entered into the medical record, reviewed, and no changes needed.   Review of Systems: No fevers, chills, night sweats, weight loss, chest pain, or shortness of breath.   Objective:    General: Well Developed, well nourished, and in no acute distress.  Neuro: Alert and oriented x3, extra-ocular muscles intact, sensation grossly intact.  HEENT: Normocephalic, atraumatic, pupils equal round reactive to light, neck supple, no masses, no lymphadenopathy, thyroid nonpalpable.  Skin: Warm and dry, no rashes. Cardiac: Regular rate and rhythm, no murmurs rubs or gallops, no lower extremity edema.  Respiratory: Clear to auscultation bilaterally. Not using accessory muscles, speaking in full sentences. Left hand: Visibly swollen left PIP joint of the fifth digit, tender to palpation at the joint line, good stability and excellent range of motion and strength.  Procedure: Real-time Ultrasound Guided Injection of left fifth proximal interphalangeal joint Device: GE Logiq E  Verbal informed consent obtained.  Time-out conducted.  Noted no overlying erythema, induration, or other signs of local infection.  Skin prepped in a sterile fashion.  Local anesthesia: Topical Ethyl chloride.  With sterile technique and under real time ultrasound guidance:  Noted synovitis and a mild joint effusion, 30-gauge needle advanced into the joint and 0.5 mL kenalog 40, 0.5 mL lidocaine injected easily. Completed without difficulty  Pain immediately resolved suggesting accurate placement of the medication.  Advised to call if fevers/chills,  erythema, induration, drainage, or persistent bleeding.  Images permanently stored and available for review in the ultrasound unit.  Impression: Technically successful ultrasound guided injection.  Impression and Recommendations:

## 2014-07-10 NOTE — Assessment & Plan Note (Signed)
Healing well.

## 2014-07-10 NOTE — Assessment & Plan Note (Addendum)
Continues to be tender to palpation on the left proximal interphalangeal joint of the fifth digit. This is after 2 weeks of immobilization, and with negative x-rays. This likely represents a fifth PIP synovitis, injection as above. Plan return to see me in one month.

## 2014-08-08 ENCOUNTER — Encounter: Payer: Self-pay | Admitting: Sports Medicine

## 2014-08-08 ENCOUNTER — Ambulatory Visit (INDEPENDENT_AMBULATORY_CARE_PROVIDER_SITE_OTHER): Payer: BLUE CROSS/BLUE SHIELD | Admitting: Sports Medicine

## 2014-08-08 VITALS — BP 113/71 | HR 80 | Ht 70.0 in | Wt 200.0 lb

## 2014-08-08 DIAGNOSIS — S63617D Unspecified sprain of left little finger, subsequent encounter: Secondary | ICD-10-CM | POA: Diagnosis not present

## 2014-08-08 NOTE — Assessment & Plan Note (Signed)
This evolved into a fifth proximal interphalangeal joint synovitis which has now responded completely to a intra-articular injection. Return as needed for this.

## 2014-08-08 NOTE — Progress Notes (Signed)
  Subjective:    CC: follow-up  HPI: Left fifth finger pain: With a proximal interphalangeal joint synovitis, did not respond to 2-1/2 weeks of immobilization after a sprain, injection has now resulted in complete pain relief.  Skin tags: Doing extremely well, healing well, no further complaints.  Past medical history, Surgical history, Family history not pertinant except as noted below, Social history, Allergies, and medications have been entered into the medical record, reviewed, and no changes needed.   Review of Systems: No fevers, chills, night sweats, weight loss, chest pain, or shortness of breath.   Objective:    General: Well Developed, well nourished, and in no acute distress.  Neuro: Alert and oriented x3, extra-ocular muscles intact, sensation grossly intact.  HEENT: Normocephalic, atraumatic, pupils equal round reactive to light, neck supple, no masses, no lymphadenopathy, thyroid nonpalpable.  Skin: Warm and dry, no rashes.  Surgical sites are healing well. Cardiac: Regular rate and rhythm, no murmurs rubs or gallops, no lower extremity edema.  Respiratory: Clear to auscultation bilaterally. Not using accessory muscles, speaking in full sentences. Left hand: Good range of motion without any tenderness or swelling at the left fifth proximal interphalangeal joint.  Impression and Recommendations:

## 2014-11-09 ENCOUNTER — Emergency Department (INDEPENDENT_AMBULATORY_CARE_PROVIDER_SITE_OTHER): Payer: BLUE CROSS/BLUE SHIELD

## 2014-11-09 ENCOUNTER — Emergency Department (INDEPENDENT_AMBULATORY_CARE_PROVIDER_SITE_OTHER)
Admission: EM | Admit: 2014-11-09 | Discharge: 2014-11-09 | Disposition: A | Discharge status: Home / Self Care | Payer: BLUE CROSS/BLUE SHIELD | Source: Home / Self Care | Attending: Emergency Medicine | Admitting: Emergency Medicine

## 2014-11-09 ENCOUNTER — Encounter: Payer: Self-pay | Admitting: Emergency Medicine

## 2014-11-09 DIAGNOSIS — S9002XA Contusion of left ankle, initial encounter: Secondary | ICD-10-CM | POA: Diagnosis not present

## 2014-11-09 DIAGNOSIS — M25475 Effusion, left foot: Secondary | ICD-10-CM

## 2014-11-09 DIAGNOSIS — S51801A Unspecified open wound of right forearm, initial encounter: Secondary | ICD-10-CM | POA: Diagnosis not present

## 2014-11-09 DIAGNOSIS — M25572 Pain in left ankle and joints of left foot: Secondary | ICD-10-CM

## 2014-11-09 DIAGNOSIS — S90122A Contusion of left lesser toe(s) without damage to nail, initial encounter: Secondary | ICD-10-CM

## 2014-11-09 DIAGNOSIS — R52 Pain, unspecified: Secondary | ICD-10-CM

## 2014-11-09 MED ORDER — CEPHALEXIN 500 MG PO CAPS: 500.0000 mg | ORAL_CAPSULE | Freq: Three times a day (TID) | ORAL | Status: DC

## 2014-11-09 MED ORDER — TETANUS-DIPHTH-ACELL PERTUSSIS 5-2.5-18.5 LF-MCG/0.5 IM SUSP: 0.5000 mL | Freq: Once | INTRAMUSCULAR | Status: DC

## 2014-11-09 MED ORDER — HYDROCODONE-ACETAMINOPHEN 5-325 MG PO TABS: 1.0000 | ORAL_TABLET | ORAL | Status: DC | PRN

## 2014-11-09 NOTE — ED Notes (Signed)
Larey Seat out of a tree 6 days ago, foot and ankle injury twisted, pain is on top of foot and radiates up leg. Abrasion right forearm

## 2014-11-09 NOTE — ED Provider Notes (Signed)
CSN: [id number]     Arrival date & time 11/09/14  1518  [facility] urgent care  History   First MD Initiated Contact with Patient 11/09/14 1528     Chief Complaint  Patient presents with  . Fall   (Consider location/radiation/quality/duration/timing/severity/associated sxs/prior Treatment) HPI 6 days ago,accidentally lost his footing while he was climbing in a tree,then slid down the trunk of the tree while holding on, but then fell about 5 feet to the ground injuring and twisting left foot and ankle. Pain is on dorsum of left foot and radiates to left lateral ankle. Pain is sharp and dull, severe, 9/10 intensity. He sustained deep abrasion/skin avulsion right forearm. Over the past 6 days, he's tried to keep the right forearm wound clean and bandaged. No drainage. No fever. Associated symptoms : Denies head or neck or back injury, focal neurologic numbness or weakness, loss of consciousness, seizures . No chest pain or shortness of breath. No nausea or vomiting. He has been able to ambulate and even ride his motorcycle here , but with pain Past Medical History  Diagnosis Date  . Anxiety   . Hyperlipidemia   . Hyperlipidemia   . Reflux gastritis   . Insomnia    Past Surgical History  Procedure Laterality Date  . Back surgery     Family History  Problem Relation Age of Onset  . Rheum arthritis Father   . Dementia Father   . Cancer Mother     breast   History  Substance Use Topics  . Smoking status: Current Every Day Smoker -- 1.50 packs/day for 25 years    Types: Cigarettes  . Smokeless tobacco: Not on file  . Alcohol Use: No    Review of Systems  All other systems reviewed and are negative.   Allergies  Tramadol  Home Medications   Prior to Admission medications   Medication Sig Start Date End Date Taking? Authorizing Provider  ALPRAZolam Prudy Feeler) 1 MG tablet Take 1 mg by mouth 3 (three) times daily as needed.     Historical Provider, MD  cephALEXin (KEFLEX) 500  MG capsule Take 1 capsule (500 mg total) by mouth 3 (three) times daily. For 7 days 11/09/14   Lajean Manes, MD  escitalopram (LEXAPRO) 20 MG tablet Take 20 mg by mouth daily.    Historical Provider, MD  gabapentin (NEURONTIN) 300 MG capsule Take 1 capsule (300 mg total) by mouth at bedtime. 06/19/14   Laren Boom, DO  HYDROcodone-acetaminophen (NORCO/VICODIN) 5-325 MG per tablet Take 1-2 tablets by mouth every 4 (four) hours as needed for severe pain. Take with food. 11/09/14   Lajean Manes, MD  meloxicam (MOBIC) 15 MG tablet Take 1 tablet (15 mg total) by mouth daily. 07/07/14   Monica Becton, MD  traZODone (DESYREL) 50 MG tablet Take 50 mg by mouth at bedtime.    Historical Provider, MD   BP 117/76 mmHg  Pulse 87  Temp(Src) 98.8 F (37.1 C) (Oral)  Ht 5\' 10"  (1.778 m)  Wt 199 lb (90.266 kg)  BMI 28.55 kg/m2  SpO2 97% Physical Exam  Constitutional: He is oriented to person, place, and time. He appears well-developed and well-nourished. No distress.  Uncomfortable from left foot and ankle pain as well as from right forearm pain.  HENT:  Head: Normocephalic and atraumatic.  Mouth/Throat: Oropharynx is clear and moist.  Eyes: Conjunctivae and EOM are normal. Pupils are equal, round, and reactive to light. No scleral icterus.  Neck: Normal range of  motion.  No C-spine tenderness or deformity  Cardiovascular: Normal rate.   Pulmonary/Chest: Effort normal.  Abdominal: He exhibits no distension. There is no tenderness.  Musculoskeletal:       Left ankle: He exhibits decreased range of motion and swelling. He exhibits no laceration and normal pulse. Tenderness. Lateral malleolus tenderness found. No head of 5th metatarsal tenderness found. Achilles tendon exhibits no defect.       Left foot: There is decreased range of motion, tenderness, bony tenderness and swelling. There is normal capillary refill.       Feet:  Neurological: He is alert and oriented to person, place, and time. No  cranial nerve deficit.  Skin: Skin is warm. Rash (8 x 10 cm full skin epidermal avulsion/of the right medial forearm) noted.  Psychiatric: He has a normal mood and affect.  Nursing note and vitals reviewed.   ED Course  Procedures (including critical care time) Labs Review Labs Reviewed - No data to display  Imaging Review Dg Ankle Complete Left  11/09/2014   CLINICAL DATA:  LEFT foot and ankle pain after falling out of a tree this past weekend, with a tree spikes, with pain swelling and bruising increased progressively since injury  EXAM: LEFT ANKLE COMPLETE - 3+ VIEW  COMPARISON:  None  FINDINGS: Osseous mineralization normal.  Ankle joint space preserved.  Mild soft tissue swelling laterally.  No acute fracture, dislocation, or bone destruction.  IMPRESSION: No acute osseous abnormalities.   Electronically Signed   By: Ulyses Southward M.D.   On: 11/09/2014 16:08   Dg Foot Complete Left  11/09/2014   CLINICAL DATA:  Left foot and ankle pain after falling out of a tree this weekend. Progressively worsening pain, swelling, and bruising. Initial encounter.  EXAM: LEFT FOOT - COMPLETE 3+ VIEW  COMPARISON:  None.  FINDINGS: No acute fracture or dislocation is identified. Joint space widths are preserved. No lytic or blastic osseous lesion is seen. There is mild soft tissue swelling on the dorsum of the foot.  IMPRESSION: Soft tissue swelling without acute osseous abnormality identified.   Electronically Signed   By: Sebastian Ache   On: 11/09/2014 16:10     MDM   1. Contusion of left ankle, initial encounter   2. Severe pain   3. Avulsion of skin of forearm, right, initial encounter   4. Contusion of  left foot, initial encounter    Treatment options discussed, as well as risks, benefits, alternatives. He's not sure when his last tetanus shot was, and I advised tetanus shot, but he refused Patient voiced understanding and agreement with the following plans: Skin avulsion right forearm was gently  cleansed with Hibiclens and Mepilex dressing applied. Cephalexin prescribed. Keep wound clean and dry Return in 2 days to redress right forearm wound.  For the severe left foot and ankle contusion:  Reviewed x-rays of left foot and ankle showed soft tissue swelling, but no acute fracture or dislocation. Encourage rest, ice, compression with ACE bandage, and elevation of injured body part. Cam Walker boot applied left ankle and foot Discharge Medication List as of 11/09/2014  4:39 PM    START taking these medications   Details  cephALEXin (KEFLEX) 500 MG capsule Take 1 capsule (500 mg total) by mouth 3 (three) times daily. For 7 days, Starting 11/09/2014, Until Discontinued, Print    HYDROcodone-acetaminophen (NORCO/VICODIN) 5-325 MG per tablet Take 1-2 tablets by mouth every 4 (four) hours as needed for severe pain. Take with food.,  Starting 11/09/2014, Until Discontinued, Print      short-term prescription for Vicodin to use when necessary acute pain. He declined any other pain prescription, as he prefers to use ibuprofen for mild to moderate pain. No work for the next 4 days. Follow-up with Orthopedist within 4 days. Precautions discussed. Red flags discussed. Questions invited and answered. Patient voiced understanding and agreement.      Lajean Manes, MD 11/09/14 [phone]

## 2014-11-09 NOTE — Discharge Instructions (Signed)
Deep Skin Avulsion °A deep skin avulsion is when all layers of the skin or parts of body structures have been torn away. This is usually a result of severe injury (trauma). A deep skin avulsion can include damage to important structures beneath the skin such as tendons, ligaments, nerves, or blood vessels.  °CAUSES  °Many injuries can lead to a deep skin avulsion. These include:  °· Crush injuries. °· Bites. °· Falls against jagged surfaces. °· Gunshot wounds. °· Severe burns and injuries involving dragging (such as those from a bicycle or motorcycle accident). °TREATMENT  °· If the wound is small and there is no damage to vital structures like nerves and blood vessels, the damaged tissues may be removed. Then, the wound can be cleaned thoroughly and closed. °· A skin graft may be performed. This is a procedure in which the outer layer of skin is removed from a different part of your body. That skin (skin graft) is used to cover the open wound. This can happen after damaged tissue is removed and repairs are completed. °· Your caregiver may only apply a bandage (dressing) to the wound. The wound will be kept clean and allowed to heal. Healing can take weeks or months and usually leaves a large scar. This type of treatment is only done if your caregiver feels that skin grafting or a similar procedure would not work. °You might need a tetanus shot if: °· You cannot remember when you had your last tetanus shot. °· You have never had a tetanus shot. °· The injury broke your skin. °If you got a tetanus shot, your arm may swell, get red, and feel warm to the touch. This is common and not a problem. If you need a tetanus shot and you choose not to have one, there is a rare chance of getting tetanus. Sickness from tetanus can be serious. °HOME CARE INSTRUCTIONS  °· Only take over-the-counter or prescription medicines for pain, discomfort, or fever as directed by your caregiver. °· Gently wash the area with mild soap and  water 2 times a day, or as directed. Rinse off the soap. Pat the area dry with a clean towel. Do not rub the wound. This may cause bleeding. °· Follow your caregiver's instructions for how often you need to change the dressing. °· Apply ointment and a dressing to the wound as directed. °· If the dressing sticks, moisten it with soapy water and gently remove it. °· Change the bandage right away if it becomes wet, dirty, or starts to smell bad. °· Take showers. Do not take tub baths, swim, or do anything that may soak the wound until it is healed. °· Use anti-itch medicine as directed by your caregiver. The wound may itch when it is healing. Do not pick or scratch at the wound. °· Follow up with your caregiver for stitches (sutures), staple, or skin adhesive strip removal. °SEEK MEDICAL CARE IF:  °· You have redness, swelling, or increasing pain in your wound. °· A red streak or line extends away from the wound. °· You have pus coming from the wound. °· You notice a bad smell coming from the wound or dressing. °· The wound breaks open (edges not staying together) after sutures have been removed. °· You notice something coming out of the wound, such as a small piece of wood, glass, or metal. °· You are unable to properly move a finger or toe if the wound is on your hand or foot. °· You have severe   swelling around the wound that causes pain and numbness.  Your arm, hand, leg, or foot changes color. SEEK IMMEDIATE MEDICAL CARE IF:   Your pain becomes severe or is not adequately relieved with pain medicine.  You have a fever.  You have nausea and vomiting for more than 24 hours.  You feel lightheaded, weak, or faint.  You develop chest pain or difficulty breathing. MAKE SURE YOU:   Understand these instructions.  Will watch your condition.  Will get help right away if you are not doing well or get worse. Document Released: 07/01/2006 Document Revised: 07/28/2011 Document Reviewed:  09/08/2010 [facility] Patient Information 2015 [place], [place]. This information is not intended to replace advice given to you by your health care provider. Make sure you discuss any questions you have with your health care provider.  Ankle Sprain An ankle sprain is an injury to the strong, fibrous tissues (ligaments) that hold the bones of your ankle joint together.  CAUSES An ankle sprain is usually caused by a fall or by twisting your ankle. Ankle sprains most commonly occur when you step on the outer edge of your foot, and your ankle turns inward. People who participate in sports are more prone to these types of injuries.  SYMPTOMS   Pain in your ankle. The pain may be present at rest or only when you are trying to stand or walk.  Swelling.  Bruising. Bruising may develop immediately or within 1 to 2 days after your injury.  Difficulty standing or walking, particularly when turning corners or changing directions. DIAGNOSIS  Your caregiver will ask you details about your injury and perform a physical exam of your ankle to determine if you have an ankle sprain. During the physical exam, your caregiver will press on and apply pressure to specific areas of your foot and ankle. Your caregiver will try to move your ankle in certain ways. An X-ray exam may be done to be sure a bone was not broken or a ligament did not separate from one of the bones in your ankle (avulsion fracture).  TREATMENT  Certain types of braces can help stabilize your ankle. Your caregiver can make a recommendation for this. Your caregiver may recommend the use of medicine for pain. If your sprain is severe, your caregiver may refer you to a surgeon who helps to restore function to parts of your skeletal system (orthopedist) or a physical therapist. HOME CARE INSTRUCTIONS   Apply ice to your injury for 1-2 days or as directed by your caregiver. Applying ice helps to reduce inflammation and pain.  Put ice in a plastic  bag.  Place a towel between your skin and the bag.  Leave the ice on for 15-20 minutes at a time, every 2 hours while you are awake.  Only take over-the-counter or prescription medicines for pain, discomfort, or fever as directed by your caregiver.  Elevate your injured ankle above the level of your heart as much as possible for 2-3 days.  If your caregiver recommends crutches, use them as instructed. Gradually put weight on the affected ankle. Continue to use crutches or a cane until you can walk without feeling pain in your ankle.  If you have a plaster splint, wear the splint as directed by your caregiver. Do not rest it on anything harder than a pillow for the first 24 hours. Do not put weight on it. Do not get it wet. You may take it off to take a shower or bath.  You may have been given an elastic bandage to wear around your ankle to provide support. If the elastic bandage is too tight (you have numbness or tingling in your foot or your foot becomes cold and blue), adjust the bandage to make it comfortable.  If you have an air splint, you may blow more air into it or let air out to make it more comfortable. You may take your splint off at night and before taking a shower or bath. Wiggle your toes in the splint several times per day to decrease swelling. SEEK MEDICAL CARE IF:   You have rapidly increasing bruising or swelling.  Your toes feel extremely cold or you lose feeling in your foot.  Your pain is not relieved with medicine. SEEK IMMEDIATE MEDICAL CARE IF:  Your toes are numb or blue.  You have severe pain that is increasing. MAKE SURE YOU:   Understand these instructions.  Will watch your condition.  Will get help right away if you are not doing well or get worse. Document Released: 05/05/2005 Document Revised: 01/28/2012 Document Reviewed: 05/17/2011 [facility] Patient Information 2015 [place], [place]. This information is not intended to replace advice given to you by  your health care provider. Make sure you discuss any questions you have with your health care provider.  For your left ankle and foot injury, follow-up with orthopedist within 7 days

## 2014-11-13 ENCOUNTER — Encounter: Payer: Self-pay | Admitting: Emergency Medicine

## 2014-11-13 ENCOUNTER — Emergency Department (INDEPENDENT_AMBULATORY_CARE_PROVIDER_SITE_OTHER)
Admission: EM | Admit: 2014-11-13 | Discharge: 2014-11-13 | Disposition: A | Discharge status: Home / Self Care | Payer: BLUE CROSS/BLUE SHIELD | Source: Home / Self Care | Attending: Emergency Medicine | Admitting: Emergency Medicine

## 2014-11-13 DIAGNOSIS — S40811A Abrasion of right upper arm, initial encounter: Secondary | ICD-10-CM | POA: Diagnosis not present

## 2014-11-13 DIAGNOSIS — M79672 Pain in left foot: Secondary | ICD-10-CM

## 2014-11-13 MED ORDER — HYDROCODONE-ACETAMINOPHEN 5-325 MG PO TABS: 1.0000 | ORAL_TABLET | ORAL | Status: DC | PRN

## 2014-11-13 MED ORDER — IBUPROFEN 800 MG PO TABS: 800.0000 mg | ORAL_TABLET | Freq: Three times a day (TID) | ORAL | Status: DC

## 2014-11-13 NOTE — ED Notes (Signed)
Pt here for wound recheck of his right forearm .

## 2014-11-13 NOTE — Discharge Instructions (Signed)
Laceration Care, Adult °A laceration is a cut or lesion that goes through all layers of the skin and into the tissue just beneath the skin. °TREATMENT  °Some lacerations may not require closure. Some lacerations may not be able to be closed due to an increased risk of infection. It is important to see your caregiver as soon as possible after an injury to minimize the risk of infection and maximize the opportunity for successful closure. °If closure is appropriate, pain medicines may be given, if needed. The wound will be cleaned to help prevent infection. Your caregiver will use stitches (sutures), staples, wound glue (adhesive), or skin adhesive strips to repair the laceration. These tools bring the skin edges together to allow for faster healing and a better cosmetic outcome. However, all wounds will heal with a scar. Once the wound has healed, scarring can be minimized by covering the wound with sunscreen during the day for 1 full year. °HOME CARE INSTRUCTIONS  °For sutures or staples: °· Keep the wound clean and dry. °· If you were given a bandage (dressing), you should change it at least once a day. Also, change the dressing if it becomes wet or dirty, or as directed by your caregiver. °· Wash the wound with soap and water 2 times a day. Rinse the wound off with water to remove all soap. Pat the wound dry with a clean towel. °· After cleaning, apply a thin layer of the antibiotic ointment as recommended by your caregiver. This will help prevent infection and keep the dressing from sticking. °· You may shower as usual after the first 24 hours. Do not soak the wound in water until the sutures are removed. °· Only take over-the-counter or prescription medicines for pain, discomfort, or fever as directed by your caregiver. °· Get your sutures or staples removed as directed by your caregiver. °For skin adhesive strips: °· Keep the wound clean and dry. °· Do not get the skin adhesive strips wet. You may bathe  carefully, using caution to keep the wound dry. °· If the wound gets wet, pat it dry with a clean towel. °· Skin adhesive strips will fall off on their own. You may trim the strips as the wound heals. Do not remove skin adhesive strips that are still stuck to the wound. They will fall off in time. °For wound adhesive: °· You may briefly wet your wound in the shower or bath. Do not soak or scrub the wound. Do not swim. Avoid periods of heavy perspiration until the skin adhesive has fallen off on its own. After showering or bathing, gently pat the wound dry with a clean towel. °· Do not apply liquid medicine, cream medicine, or ointment medicine to your wound while the skin adhesive is in place. This may loosen the film before your wound is healed. °· If a dressing is placed over the wound, be careful not to apply tape directly over the skin adhesive. This may cause the adhesive to be pulled off before the wound is healed. °· Avoid prolonged exposure to sunlight or tanning lamps while the skin adhesive is in place. Exposure to ultraviolet light in the first year will darken the scar. °· The skin adhesive will usually remain in place for 5 to 10 days, then naturally fall off the skin. Do not pick at the adhesive film. °You may need a tetanus shot if: °· You cannot remember when you had your last tetanus shot. °· You have never had a tetanus   shot. If you get a tetanus shot, your arm may swell, get red, and feel warm to the touch. This is common and not a problem. If you need a tetanus shot and you choose not to have one, there is a rare chance of getting tetanus. Sickness from tetanus can be serious. SEEK MEDICAL CARE IF:   You have redness, swelling, or increasing pain in the wound.  You see a red line that goes away from the wound.  You have yellowish-white fluid (pus) coming from the wound.  You have a fever.  You notice a bad smell coming from the wound or dressing.  Your wound breaks open before or  after sutures have been removed.  You notice something coming out of the wound such as wood or glass.  Your wound is on your hand or foot and you cannot move a finger or toe. SEEK IMMEDIATE MEDICAL CARE IF:   Your pain is not controlled with prescribed medicine.  You have severe swelling around the wound causing pain and numbness or a change in color in your arm, hand, leg, or foot.  Your wound splits open and starts bleeding.  You have worsening numbness, weakness, or loss of function of any joint around or beyond the wound.  You develop painful lumps near the wound or on the skin anywhere on your body. MAKE SURE YOU:   Understand these instructions.  Will watch your condition.  Will get help right away if you are not doing well or get worse. Document Released: 05/05/2005 Document Revised: 07/28/2011 Document Reviewed: 10/29/2010 [facility] Patient Information 2015 [place]. This information is not intended to replace advice given to you by your health care provider. Make sure you discuss any questions you have with your health care provider. Foot Sprain The muscles and cord like structures which attach muscle to bone (tendons) that surround the feet are made up of units. A foot sprain can occur at the weakest spot in any of these units. This condition is most often caused by injury to or overuse of the foot, as from playing contact sports, or aggravating a previous injury, or from poor conditioning, or obesity. SYMPTOMS  Pain with movement of the foot.  Tenderness and swelling at the injury site.  Loss of strength is present in moderate or severe sprains. THE THREE GRADES OR SEVERITY OF FOOT SPRAIN ARE:  Mild (Grade I): Slightly pulled muscle without tearing of muscle or tendon fibers or loss of strength.  Moderate (Grade II): Tearing of fibers in a muscle, tendon, or at the attachment to bone, with small decrease in strength.  Severe (Grade III): Rupture of the  muscle-tendon-bone attachment, with separation of fibers. Severe sprain requires surgical repair. Often repeating (chronic) sprains are caused by overuse. Sudden (acute) sprains are caused by direct injury or over-use. DIAGNOSIS  Diagnosis of this condition is usually by your own observation. If problems continue, a caregiver may be required for further evaluation and treatment. X-rays may be required to make sure there are not breaks in the bones (fractures) present. Continued problems may require physical therapy for treatment. PREVENTION  Use strength and conditioning exercises appropriate for your sport.  Warm up properly prior to working out.  Use athletic shoes that are made for the sport you are participating in.  Allow adequate time for healing. Early return to activities makes repeat injury more likely, and can lead to an unstable arthritic foot that can result in prolonged disability. Mild sprains generally heal  in 3 to 10 days, with moderate and severe sprains taking 2 to 10 weeks. Your caregiver can help you determine the proper time required for healing. HOME CARE INSTRUCTIONS   Apply ice to the injury for 15-20 minutes, 03-04 times per day. Put the ice in a plastic bag and place a towel between the bag of ice and your skin.  An elastic wrap (like an Ace bandage) may be used to keep swelling down.  Keep foot above the level of the heart, or at least raised on a footstool, when swelling and pain are present.  Try to avoid use other than gentle range of motion while the foot is painful. Do not resume use until instructed by your caregiver. Then begin use gradually, not increasing use to the point of pain. If pain does develop, decrease use and continue the above measures, gradually increasing activities that do not cause discomfort, until you gradually achieve normal use.  Use crutches if and as instructed, and for the length of time instructed.  Keep injured foot and ankle wrapped  between treatments.  Massage foot and ankle for comfort and to keep swelling down. Massage from the toes up towards the knee.  Only take over-the-counter or prescription medicines for pain, discomfort, or fever as directed by your caregiver. SEEK IMMEDIATE MEDICAL CARE IF:   Your pain and swelling increase, or pain is not controlled with medications.  You have loss of feeling in your foot or your foot turns cold or blue.  You develop new, unexplained symptoms, or an increase of the symptoms that brought you to your caregiver. MAKE SURE YOU:   Understand these instructions.  Will watch your condition.  Will get help right away if you are not doing well or get worse. Document Released: 10/25/2001 Document Revised: 07/28/2011 Document Reviewed: 12/23/2007 [facility] Patient Information 2015 [place], [place]. This information is not intended to replace advice given to you by your health care provider. Make sure you discuss any questions you have with your health care provider.

## 2014-11-13 NOTE — ED Provider Notes (Signed)
CSN: [id number]     Arrival date & time 11/13/14  1519 History   First MD Initiated Contact with Patient 11/13/14 1536     Chief Complaint  Patient presents with  . Wound Check   (Consider location/radiation/quality/duration/timing/severity/associated sxs/prior Treatment) Patient is a 44 y.o. male presenting with wound check. The history is provided by the patient. No language interpreter was used.  Wound Check This is a new problem. The problem occurs constantly. The problem has been gradually worsening. Pertinent negatives include no chest pain. Nothing aggravates the symptoms. Nothing relieves the symptoms. He has tried nothing for the symptoms.  Pt here for recheck of abrasions to arms and pain in foot.  Pt reports abrasions are healing.  Pt still reports pain in his foot.  Pt unable to wear boot.  Pt reports he has to work  Past Medical History  Diagnosis Date  . Anxiety   . Hyperlipidemia   . Hyperlipidemia   . Reflux gastritis   . Insomnia    Past Surgical History  Procedure Laterality Date  . Back surgery     Family History  Problem Relation Age of Onset  . Rheum arthritis Father   . Dementia Father   . Cancer Mother     breast   History  Substance Use Topics  . Smoking status: Current Every Day Smoker -- 1.50 packs/day for 25 years    Types: Cigarettes  . Smokeless tobacco: Not on file  . Alcohol Use: No    Review of Systems  Cardiovascular: Negative for chest pain.  Musculoskeletal: Positive for myalgias.  Skin: Positive for wound.  All other systems reviewed and are negative.   Allergies  Tramadol  Home Medications   Prior to Admission medications   Medication Sig Start Date End Date Taking? Authorizing Provider  ALPRAZolam Prudy Feeler) 1 MG tablet Take 1 mg by mouth 3 (three) times daily as needed.     Historical Provider, MD  cephALEXin (KEFLEX) 500 MG capsule Take 1 capsule (500 mg total) by mouth 3 (three) times daily. For 7 days 11/09/14   Lajean Manes, MD  escitalopram (LEXAPRO) 20 MG tablet Take 20 mg by mouth daily.    Historical Provider, MD  gabapentin (NEURONTIN) 300 MG capsule Take 1 capsule (300 mg total) by mouth at bedtime. 06/19/14   Laren Boom, DO  HYDROcodone-acetaminophen (NORCO/VICODIN) 5-325 MG per tablet Take 1-2 tablets by mouth every 4 (four) hours as needed for severe pain. Take with food. 11/13/14   Elson Areas, PA-C  ibuprofen (ADVIL,MOTRIN) 800 MG tablet Take 1 tablet (800 mg total) by mouth 3 (three) times daily. 11/13/14   Elson Areas, PA-C  meloxicam (MOBIC) 15 MG tablet Take 1 tablet (15 mg total) by mouth daily. 07/07/14   Monica Becton, MD  traZODone (DESYREL) 50 MG tablet Take 50 mg by mouth at bedtime.    Historical Provider, MD   BP 119/80 mmHg  Pulse 98  Temp(Src) 98.3 F (36.8 C) (Oral)  SpO2 96% Physical Exam  Constitutional: He is oriented to person, place, and time. He appears well-developed and well-nourished.  Musculoskeletal: He exhibits tenderness.  Tender mid left foot and 5th metatarsal, from,  nv and ns intact   Neurological: He is alert and oriented to person, place, and time.  Skin: Skin is warm.  Healing abrasion right forearm granulation tissue,  Multiple scattered abrasions, no sign of infection  Psychiatric: He has a normal mood and affect.  Nursing note and  vitals reviewed.   ED Course  Procedures (including critical care time) Labs Review Labs Reviewed - No data to display  Imaging Review No results found.   MDM   1. Abrasion of right arm, initial encounter   2. Pain in left foot    Pt given rx for hydrocodone He is advised to see Dr. Karie Schwalbet for recheck Ibuprofen    Elson AreasLeslie K Sofia, PA-C 11/13/14 1818

## 2014-11-24 ENCOUNTER — Ambulatory Visit: Payer: Self-pay | Admitting: Sports Medicine

## 2015-04-13 ENCOUNTER — Encounter: Payer: Self-pay | Admitting: *Deleted

## 2015-04-13 ENCOUNTER — Emergency Department (INDEPENDENT_AMBULATORY_CARE_PROVIDER_SITE_OTHER)
Admission: EM | Admit: 2015-04-13 | Discharge: 2015-04-13 | Disposition: A | Discharge status: Home / Self Care | Payer: BLUE CROSS/BLUE SHIELD | Source: Home / Self Care | Attending: Family Medicine | Admitting: Family Medicine

## 2015-04-13 ENCOUNTER — Emergency Department (INDEPENDENT_AMBULATORY_CARE_PROVIDER_SITE_OTHER): Payer: BLUE CROSS/BLUE SHIELD

## 2015-04-13 DIAGNOSIS — G500 Trigeminal neuralgia: Secondary | ICD-10-CM | POA: Diagnosis not present

## 2015-04-13 DIAGNOSIS — S0083XA Contusion of other part of head, initial encounter: Secondary | ICD-10-CM

## 2015-04-13 MED ORDER — MELOXICAM 15 MG PO TABS: 15.0000 mg | ORAL_TABLET | Freq: Every day | ORAL | Status: DC
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
## 2015-04-13 NOTE — Discharge Instructions (Signed)
Apply ice pack for 10 to 15 minutes, 3 to 4 times daily  Continue until swelling decreases; may then begin warm compresses to decrease bruising.    Facial or Scalp Contusion A facial or scalp contusion is a deep bruise on the face or head. Injuries to the face and head generally cause a lot of swelling, especially around the eyes. Contusions are the result of an injury that caused bleeding under the skin. The contusion may turn blue, purple, or yellow. Minor injuries will give you a painless contusion, but more severe contusions may stay painful and swollen for a few weeks.  CAUSES  A facial or scalp contusion is caused by a blunt injury or trauma to the face or head area.  SIGNS AND SYMPTOMS   Swelling of the injured area.   Discoloration of the injured area.   Tenderness, soreness, or pain in the injured area.  DIAGNOSIS  The diagnosis can be made by taking a medical history and doing a physical exam. An X-ray exam, CT scan, or MRI may be needed to determine if there are any associated injuries, such as broken bones (fractures). TREATMENT  Often, the best treatment for a facial or scalp contusion is applying cold compresses to the injured area. Over-the-counter medicines may also be recommended for pain control.  HOME CARE INSTRUCTIONS   Only take over-the-counter or prescription medicines as directed by your health care provider.   Apply ice to the injured area.   Put ice in a plastic bag.   Place a towel between your skin and the bag.   Leave the ice on for 20 minutes, 2-3 times a day.  SEEK MEDICAL CARE IF:  You have bite problems.   You have pain with chewing.   You are concerned about facial defects. SEEK IMMEDIATE MEDICAL CARE IF:  You have severe pain or a headache that is not relieved by medicine.   You have unusual sleepiness, confusion, or personality changes.   You throw up (vomit).   You have a persistent nosebleed.   You have double vision or  blurred vision.   You have fluid drainage from your nose or ear.   You have difficulty walking or using your arms or legs.  MAKE SURE YOU:   Understand these instructions.  Will watch your condition.  Will get help right away if you are not doing well or get worse.   This information is not intended to replace advice given to you by your health care provider. Make sure you discuss any questions you have with your health care provider.   Document Released: 06/12/2004 Document Revised: 05/26/2014 Document Reviewed: 12/16/2012 Elsevier Interactive Patient Education [employer]2016 Elsevier Inc.

## 2015-04-16 ENCOUNTER — Telehealth: Payer: Self-pay | Admitting: *Deleted

## 2016-04-03 ENCOUNTER — Telehealth: Payer: Self-pay | Admitting: Emergency Medicine

## 2016-04-03 ENCOUNTER — Encounter: Payer: Self-pay | Admitting: *Deleted

## 2016-04-03 ENCOUNTER — Emergency Department (INDEPENDENT_AMBULATORY_CARE_PROVIDER_SITE_OTHER)
Admission: EM | Admit: 2016-04-03 | Discharge: 2016-04-03 | Disposition: A | Discharge status: Home / Self Care | Payer: BLUE CROSS/BLUE SHIELD | Source: Home / Self Care | Attending: Family Medicine | Admitting: Family Medicine

## 2016-04-03 DIAGNOSIS — R112 Nausea with vomiting, unspecified: Secondary | ICD-10-CM | POA: Diagnosis not present

## 2016-04-03 DIAGNOSIS — R197 Diarrhea, unspecified: Secondary | ICD-10-CM | POA: Diagnosis not present

## 2016-04-03 MED ORDER — ONDANSETRON 4 MG PO TBDP: ORAL_TABLET | ORAL | 0 refills | Status: DC

## 2016-04-03 NOTE — ED Provider Notes (Signed)
[facility]    CSN: [id number] Arrival date & time: 04/03/16  1451     History   Chief Complaint Chief Complaint  Patient presents with  . Fever  . Emesis  . Chills    HPI Ronald Chavez is a 45 y.o. male.   Patient complains of onset of fever, chills, fatigue, nausea/vomiting, and diarrhea two days ago.  He states that he is beginning to improve, but still has mild nausea.  Denies recent foreign travel, or drinking untreated water in a wilderness environment.      The history is provided by the patient.  Emesis  Severity:  Mild Duration:  2 days Timing:  Intermittent Quality:  Stomach contents Able to tolerate:  Liquids Progression:  Resolved Chronicity:  New Recent urination:  Normal Relieved by:  None tried Exacerbated by: food. Ineffective treatments:  None tried Associated symptoms: abdominal pain, chills, diarrhea and myalgias   Associated symptoms: no arthralgias, no cough, no fever, no headaches, no sore throat and no URI   Risk factors: no sick contacts, no suspect food intake and no travel to endemic areas     Past Medical History:  Diagnosis Date  . Anxiety   . Hyperlipidemia   . Hyperlipidemia   . Insomnia   . Reflux gastritis     Patient Active Problem List   Diagnosis Date Noted  . Hyperlipidemia 06/19/2014  . Umbilical hernia without obstruction and without gangrene 06/19/2014  . Lumbar degenerative disc disease 06/13/2014  . Sprain of fifth finger of left hand 06/13/2014  . Skin tag 06/13/2014  . GAD (generalized anxiety disorder) 02/05/2012  . Depressive disorder 02/05/2012    Past Surgical History:  Procedure Laterality Date  . BACK SURGERY         Home Medications    Prior to Admission medications   Medication Sig Start Date End Date Taking? Authorizing Provider  ALPRAZolam Prudy Feeler(XANAX) 1 MG tablet Take 1 mg by mouth 3 (three) times daily as needed.    Yes Historical Provider, MD  escitalopram (LEXAPRO) 20 MG tablet  Take 20 mg by mouth daily.   Yes Historical Provider, MD  gabapentin (NEURONTIN) 300 MG capsule Take 1 capsule (300 mg total) by mouth at bedtime. 06/19/14   Laren BoomSean Hommel, DO  meloxicam (MOBIC) 15 MG tablet Take 1 tablet (15 mg total) by mouth daily. Take with food each morning 04/13/15   Lattie HawStephen A Beese, MD  ondansetron (ZOFRAN ODT) 4 MG disintegrating tablet Take one tab by mouth Q6hr prn nausea (Dissolve under tongue) 04/03/16   Lattie HawStephen A Beese, MD  traZODone (DESYREL) 50 MG tablet Take 50 mg by mouth at bedtime.    Historical Provider, MD    Family History Family History  Problem Relation Age of Onset  . Rheum arthritis Father   . Dementia Father   . Cancer Mother     breast    Social History Social History  Substance Use Topics  . Smoking status: Current Every Day Smoker    Packs/day: 1.50    Years: 25.00    Types: Cigarettes  . Smokeless tobacco: Never Used  . Alcohol use No     Allergies   Tramadol   Review of Systems Review of Systems  Constitutional: Positive for chills. Negative for fever.  HENT: Negative for sore throat.   Respiratory: Negative for cough.   Gastrointestinal: Positive for abdominal pain, diarrhea and vomiting.  Musculoskeletal: Positive for myalgias. Negative for arthralgias.  Neurological: Negative for headaches.  All other systems reviewed and are negative.    Physical Exam Triage Vital Signs ED Triage Vitals  Enc Vitals Group     BP 04/03/16 1507 131/89     Pulse Rate 04/03/16 1507 109     Resp 04/03/16 1507 14     Temp 04/03/16 1507 99.1 F (37.3 C)     Temp Source 04/03/16 1507 Oral     SpO2 04/03/16 1507 96 %     Weight 04/03/16 1507 195 lb (88.5 kg)     Height --      Head Circumference --      Peak Flow --      Pain Score 04/03/16 1508 0     Pain Loc --      Pain Edu? --      Excl. in GC? --    No data found.   Updated Vital Signs BP 131/89 (BP Location: Left Arm)   Pulse 109   Temp 99.1 F (37.3 C) (Oral)   Resp  14   Wt 195 lb (88.5 kg)   SpO2 96%   BMI 27.98 kg/m   Visual Acuity Right Eye Distance:   Left Eye Distance:   Bilateral Distance:    Right Eye Near:   Left Eye Near:    Bilateral Near:     Physical Exam Nursing notes and Vital Signs reviewed. Appearance:  Patient appears stated age, and in no acute distress Eyes:  Pupils are equal, round, and reactive to light and accomodation.  Extraocular movement is intact.  Conjunctivae are not inflamed  Ears:  Canals normal.  Tympanic membranes normal.  Nose:   Normal turbinates.  No sinus tenderness.    Pharynx:  Normal; moist mucous membranes  Neck:  Supple.  No adenopathy Lungs:  Clear to auscultation.  Breath sounds are equal.  Moving air well. Heart:  Regular rate and rhythm without murmurs, rubs, or gallops.  Abdomen:  Nontender without masses or hepatosplenomegaly.  Bowel sounds are present.  No CVA or flank tenderness.  Extremities:  No edema.  Skin:  No rash present.    UC Treatments / Results  Labs (all labs ordered are listed, but only abnormal results are displayed) Labs Reviewed - No data to display  EKG  EKG Interpretation None       Radiology No results found.  Procedures Procedures (including critical care time)  Medications Ordered in UC Medications - No data to display   Initial Impression / Assessment and Plan / UC Course  I have reviewed the triage vital signs and the nursing notes.  Pertinent labs & imaging results that were available during my care of the patient were reviewed by me and considered in my medical decision making (see chart for details).  Clinical Course   Suspect viral gastroenteritis, improving.  Rx written Zofran ODT 4mg . Begin clear liquids (including Jell) for about 12 hours, then may begin a BRAT diet (Bananas, Rice, Applesauce, Toast) when nausea and dizziness resolved.  Then gradually advance to a regular diet as tolerated.  Avoid milk products until well.   If symptoms  become significantly worse during the night or over the weekend, proceed to the local emergency room.      Final Clinical Impressions(s) / UC Diagnoses   Final diagnoses:  Nausea vomiting and diarrhea    New Prescriptions New Prescriptions   ONDANSETRON (ZOFRAN ODT) 4 MG DISINTEGRATING TABLET    Take one tab by mouth Q6hr prn nausea (Dissolve under tongue)  Lattie HawStephen A Beese, MD 04/10/16 [phone]

## 2016-04-03 NOTE — Discharge Instructions (Signed)
Begin clear liquids (including Jell) for about 12 hours, then may begin a BRAT diet (Bananas, Rice, Applesauce, Toast) when nausea and dizziness resolved.  Then gradually advance to a regular diet as tolerated.  Avoid milk products until well.   If symptoms become significantly worse during the night or over the weekend, proceed to the local emergency room.

## 2016-04-03 NOTE — ED Triage Notes (Signed)
Patient c/o fever, chills, vomiting and diarrhea that started 2 days ago. Reports he is starting to improve.

## 2016-04-03 NOTE — Telephone Encounter (Signed)
HR Rep from [employer] called and asked if we saw patient on 03/31/16, I advised her that we saw the patient  Today, 04/03/16.  She asked me how we knew he was sick from 11/14, as his work note said, I advised her that was the history we were given by the patient.  She said she knows for a fact the patient was incarcerated for the past 2 days, I advised her that doesn't matter,  he provided the history of his illness from 11/14. Advised Dr Cathren HarshBeese of the call.

## 2016-07-21 IMAGING — CR DG FACIAL BONES COMPLETE 3+V
4 series · 4 of 4 positions shown · non-contrast
Comparison: None.

CLINICAL DATA: Swelling.  Tenderness.  Injury.

EXAM:
FACIAL BONES COMPLETE 3+V

[facial pa [person_name]]
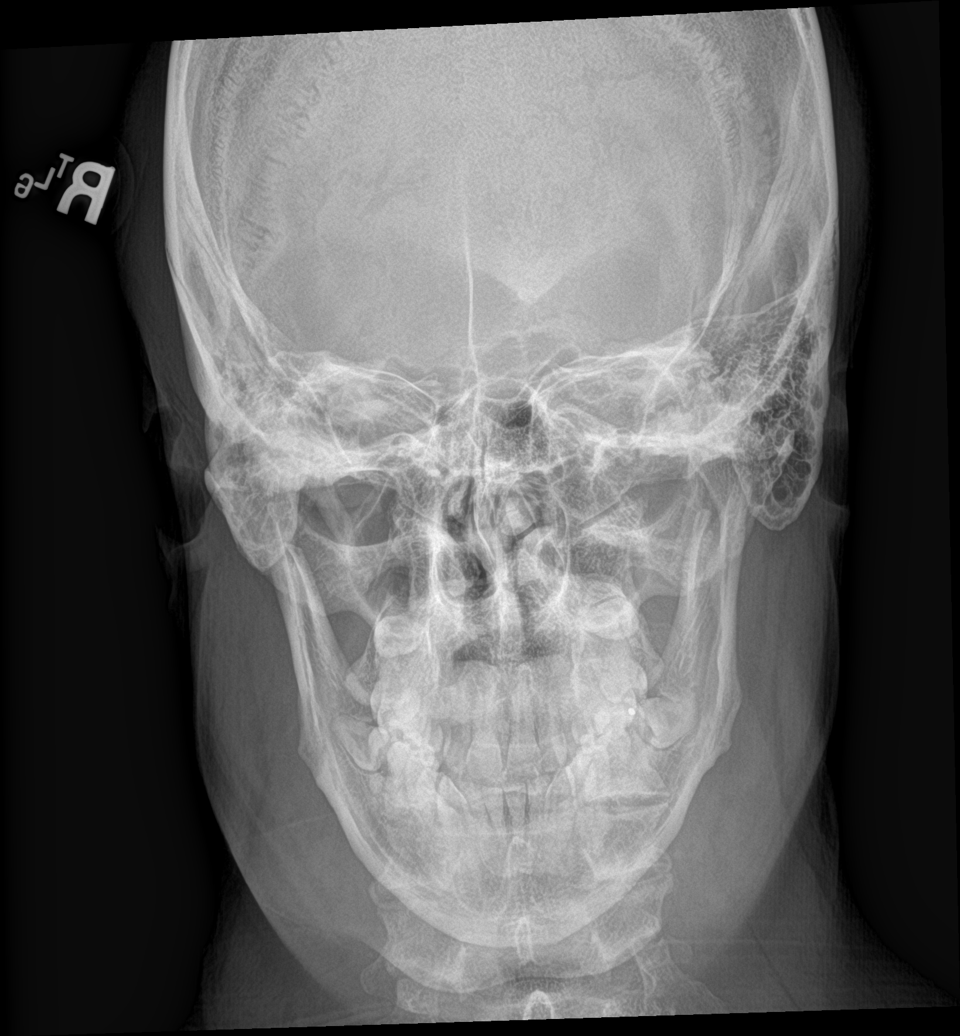

[facial waters]
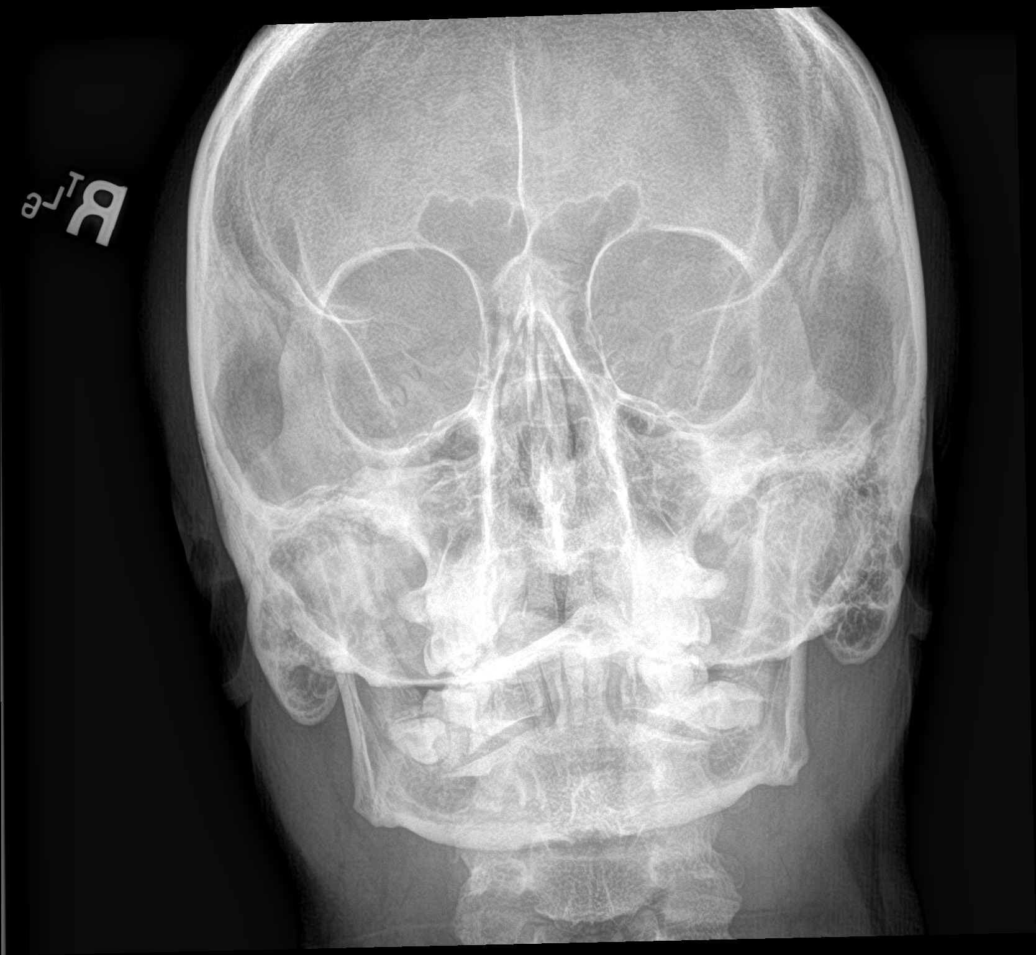

[facial lateral]
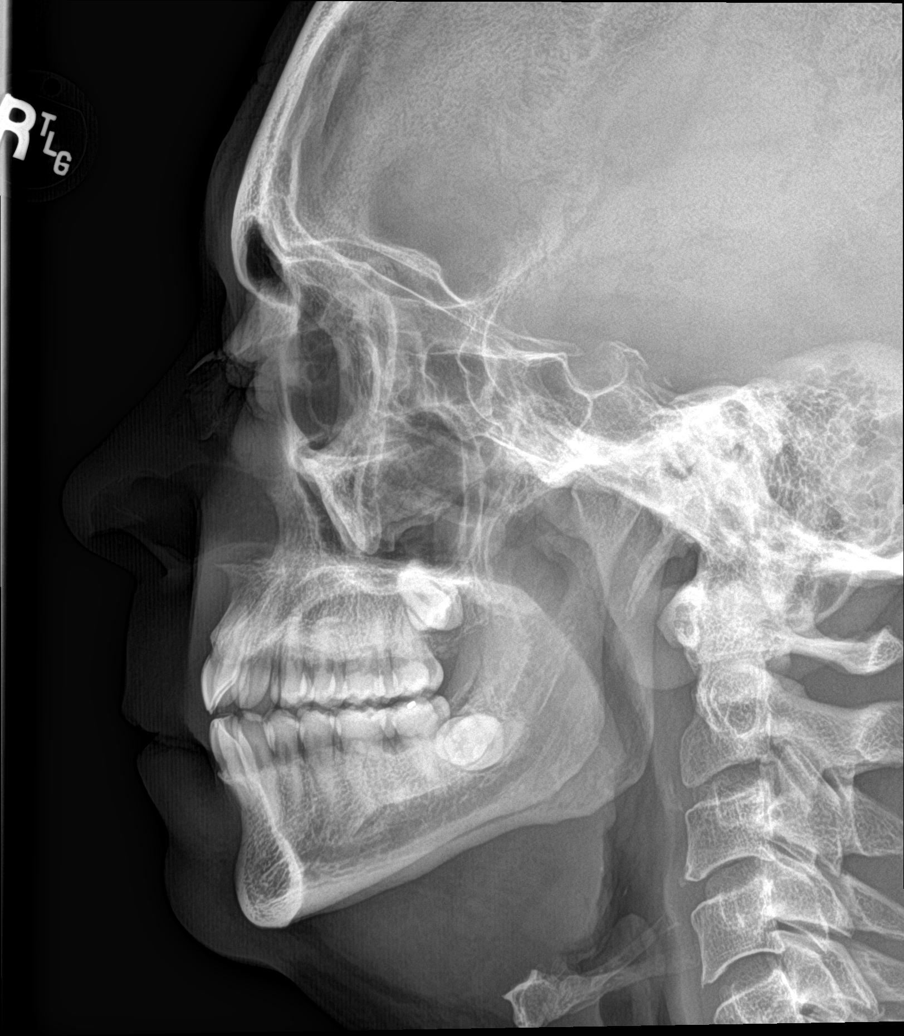

[facial smv]
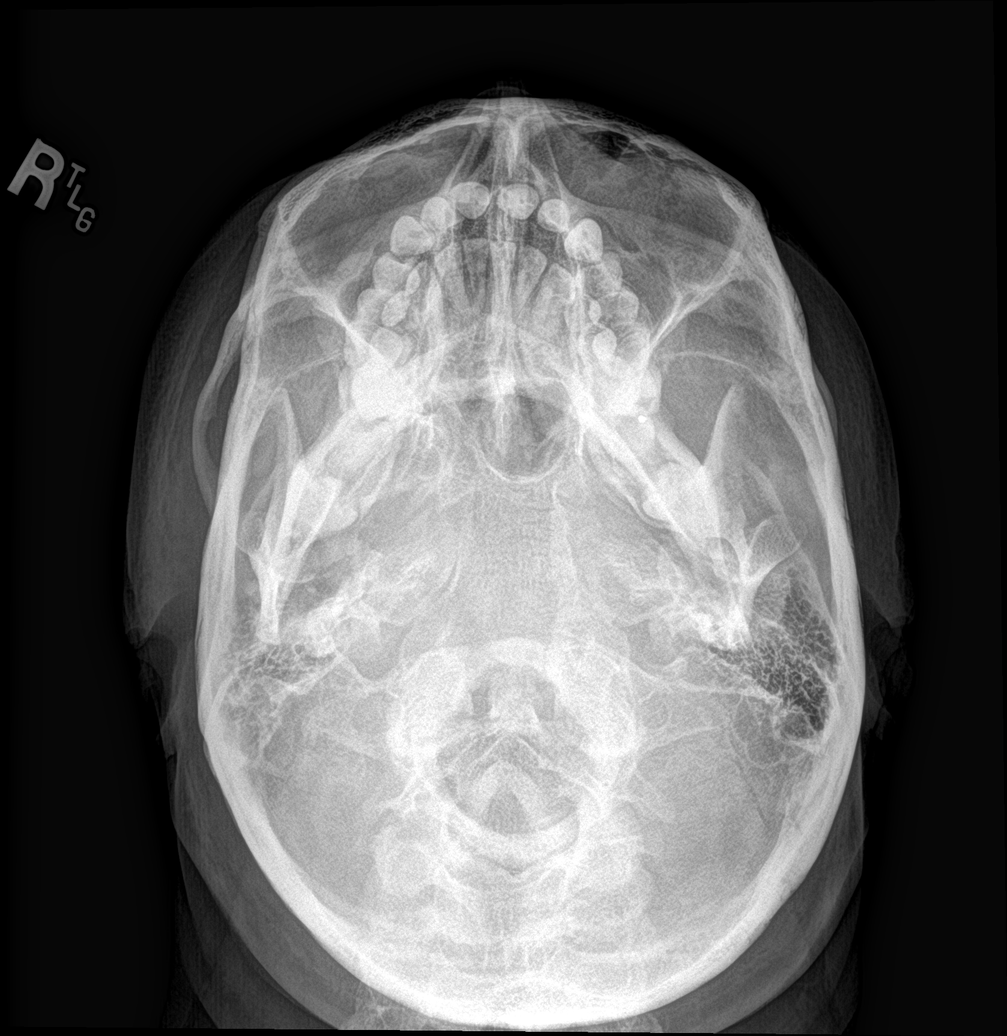

[4 of 4 positions shown; findings below may reference images not displayed]

FINDINGS: No acute bony or joint abnormality identified. No evidence of
fracture. Paranasal sinuses are clear. Orbits are intact. Mastoids
are clear. Mandible is unremarkable.
IMPRESSION: No acute abnormality.

## 2017-11-27 ENCOUNTER — Other Ambulatory Visit: Payer: Self-pay

## 2017-11-27 ENCOUNTER — Emergency Department
Admission: EM | Admit: 2017-11-27 | Discharge: 2017-11-27 | Disposition: A | Discharge status: Home / Self Care | Payer: PRIVATE HEALTH INSURANCE | Source: Home / Self Care | Attending: Family Medicine | Admitting: Family Medicine

## 2017-11-27 DIAGNOSIS — H6121 Impacted cerumen, right ear: Secondary | ICD-10-CM | POA: Diagnosis not present

## 2017-11-27 MED ORDER — NEOMYCIN-POLYMYXIN-HC 3.5-10000-1 OT SUSP: 4.0000 [drp] | Freq: Three times a day (TID) | OTIC | 0 refills | Status: DC

## 2017-11-27 MED ORDER — AMOXICILLIN 875 MG PO TABS: 875.0000 mg | ORAL_TABLET | Freq: Two times a day (BID) | ORAL | 0 refills | Status: DC

## 2017-11-27 NOTE — ED Provider Notes (Signed)
[facility]    CSN: [id number] Arrival date & time: 11/27/17  1433     History   Chief Complaint Chief Complaint  Patient presents with  . Otalgia    Right    HPI Ronald Chavez is a 47 y.o. male.   Patient has had sensation of blockage in his ears for about 4 to 5 days.  Two days ago he began to develop pain in his right ear.  No nasal congestion or URI symptoms.  No fevers, chills, and sweats.  No drainage from his ears.  The history is provided by the patient.  Ear Fullness  This is a new problem. The current episode started more than 2 days ago. The problem occurs constantly. The problem has been gradually worsening. Pertinent negatives include no headaches. Nothing aggravates the symptoms. Nothing relieves the symptoms. Treatments tried: OTC ear drops. The treatment provided no relief.    Past Medical History:  Diagnosis Date  . Anxiety   . Hyperlipidemia   . Hyperlipidemia   . Insomnia   . Reflux gastritis     Patient Active Problem List   Diagnosis Date Noted  . Hyperlipidemia 06/19/2014  . Umbilical hernia without obstruction and without gangrene 06/19/2014  . Lumbar degenerative disc disease 06/13/2014  . Sprain of fifth finger of left hand 06/13/2014  . Skin tag 06/13/2014  . GAD (generalized anxiety disorder) 02/05/2012  . Depressive disorder 02/05/2012    Past Surgical History:  Procedure Laterality Date  . BACK SURGERY         Home Medications    Prior to Admission medications   Medication Sig Start Date End Date Taking? Authorizing Provider  ALPRAZolam Prudy Feeler) 1 MG tablet Take 1 mg by mouth 3 (three) times daily as needed.     [provider]  amoxicillin (AMOXIL) 875 MG tablet Take 1 tablet (875 mg total) by mouth 2 (two) times daily. 11/27/17   Lattie Haw, MD  escitalopram (LEXAPRO) 20 MG tablet Take 20 mg by mouth daily.    [provider]  gabapentin (NEURONTIN) 300 MG capsule Take 1 capsule (300 mg  total) by mouth at bedtime. 06/19/14   Laren Boom, DO  meloxicam (MOBIC) 15 MG tablet Take 1 tablet (15 mg total) by mouth daily. Take with food each morning 04/13/15   Lattie Haw, MD  neomycin-polymyxin-hydrocortisone (CORTISPORIN) 3.5-10000-1 OTIC suspension Place 4 drops into the right ear 3 (three) times daily. 11/27/17   Lattie Haw, MD  ondansetron (ZOFRAN ODT) 4 MG disintegrating tablet Take one tab by mouth Q6hr prn nausea (Dissolve under tongue) 04/03/16   Lattie Haw, MD  traZODone (DESYREL) 50 MG tablet Take 50 mg by mouth at bedtime.    [provider]    Family History Family History  Problem Relation Age of Onset  . Rheum arthritis Father   . Dementia Father   . Cancer Mother        breast    Social History Social History   Tobacco Use  . Smoking status: Current Every Day Smoker    Packs/day: 1.50    Years: 25.00    Pack years: 37.50    Types: Cigarettes  . Smokeless tobacco: Never Used  Substance Use Topics  . Alcohol use: No    Alcohol/week: 0.0 oz  . Drug use: No     Allergies   Tramadol   Review of Systems Review of Systems  Neurological: Negative for headaches.  All other  systems reviewed and are negative.    Physical Exam Triage Vital Signs ED Triage Vitals  Enc Vitals Group     BP 11/27/17 1534 137/84     Pulse Rate 11/27/17 1534 73     Resp --      Temp 11/27/17 1534 97.9 F (36.6 C)     Temp Source 11/27/17 1534 Oral     SpO2 11/27/17 1534 97 %     Weight 11/27/17 1535 190 lb (86.2 kg)     Height 11/27/17 1535 5\' 10"  (1.778 m)     Head Circumference --      Peak Flow --      Pain Score 11/27/17 1534 3     Pain Loc --      Pain Edu? --      Excl. in GC? --    No data found.  Updated Vital Signs BP 137/84 (BP Location: Right Arm)   Pulse 73   Temp 97.9 F (36.6 C) (Oral)   Ht 5\' 10"  (1.778 m)   Wt 190 lb (86.2 kg)   SpO2 97%   BMI 27.26 kg/m   Visual Acuity Right Eye Distance:   Left Eye  Distance:   Bilateral Distance:    Right Eye Near:   Left Eye Near:    Bilateral Near:     Physical Exam Nursing notes and Vital Signs reviewed. Appearance:  Patient appears stated age, and in no acute distress Eyes:  Pupils are equal, round, and reactive to light and accomodation.  Extraocular movement is intact.  Conjunctivae are not inflamed  Ears:   Right canal occluded with cerumen; post lavage, significant cerumen remains.  Left canal partly occluded with cerumen.  Post lavage, left tympanic membrane appears normal. Nose:  Mildly congested turbinates.  No sinus tenderness.   Pharynx:  Normal Neck:  Supple; no adenopathy Lungs:  Normal respiration Heart:  Normal rate      UC Treatments / Results  Labs (all labs ordered are listed, but only abnormal results are displayed) Labs Reviewed - No data to display  EKG None  Radiology No results found.  Procedures Procedures  Ear lavage by nurse.  Medications Ordered in UC Medications - No data to display  Initial Impression / Assessment and Plan / UC Course  I have reviewed the triage vital signs and the nursing notes.  Pertinent labs & imaging results that were available during my care of the patient were reviewed by me and considered in my medical decision making (see chart for details).    Begin empiric Cortisporin otic suspension to right ear, and oral amoxicillin.  Return for repeat right ear lavage in about 5 days (may also follow-up with ENT)   Final Clinical Impressions(s) / UC Diagnoses   Final diagnoses:  Hearing loss of right ear due to cerumen impaction   Discharge Instructions   None    ED Prescriptions    Medication Sig Dispense Auth. Provider   neomycin-polymyxin-hydrocortisone (CORTISPORIN) 3.5-10000-1 OTIC suspension Place 4 drops into the right ear 3 (three) times daily. 7.5 mL Lattie HawBeese, Stephen A, MD   amoxicillin (AMOXIL) 875 MG tablet Take 1 tablet (875 mg total) by mouth 2 (two) times daily. 14  tablet Lattie HawBeese, Stephen A, MD         Lattie HawBeese, Stephen A, MD 12/04/17 1030

## 2017-11-27 NOTE — ED Triage Notes (Signed)
Pt c/o RT ear pain x 2 days that may be starting in other ear. Has tried OTC drops with no relief.

## 2019-02-24 ENCOUNTER — Emergency Department (INDEPENDENT_AMBULATORY_CARE_PROVIDER_SITE_OTHER)
Admission: EM | Admit: 2019-02-24 | Discharge: 2019-02-24 | Disposition: A | Discharge status: Home / Self Care | Payer: PRIVATE HEALTH INSURANCE | Source: Home / Self Care

## 2019-02-24 ENCOUNTER — Other Ambulatory Visit: Payer: Self-pay

## 2019-02-24 DIAGNOSIS — H6691 Otitis media, unspecified, right ear: Secondary | ICD-10-CM | POA: Diagnosis not present

## 2019-02-24 MED ORDER — AMOXICILLIN-POT CLAVULANATE 875-125 MG PO TABS: 1.0000 | ORAL_TABLET | Freq: Two times a day (BID) | ORAL | 0 refills | Status: DC

## 2019-02-24 NOTE — ED Provider Notes (Signed)
[facility]    CSN: [id number] Arrival date & time: 02/24/19  1858      History   Chief Complaint Chief Complaint  Patient presents with  . Otalgia    RT    HPI Ronald Chavez is a 48 y.o. male.   HPI  Ronald Chavez is a 48 y.o. male presenting to UC with c/o 1 week of gradually worsening Right ear pain that is aching and sharp at times, waxes and wanes, now starting to hurt in his Left ear. Mild nasal congestion. Denies fever, chills, cough, sore throat, n/v/d. No known sick contacts. He has taken ibuprofen with mild temporary relief.    Past Medical History:  Diagnosis Date  . Anxiety   . Hyperlipidemia   . Hyperlipidemia   . Insomnia   . Reflux gastritis     Patient Active Problem List   Diagnosis Date Noted  . Hyperlipidemia 06/19/2014  . Umbilical hernia without obstruction and without gangrene 06/19/2014  . Lumbar degenerative disc disease 06/13/2014  . Sprain of fifth finger of left hand 06/13/2014  . Skin tag 06/13/2014  . GAD (generalized anxiety disorder) 02/05/2012  . Depressive disorder 02/05/2012    Past Surgical History:  Procedure Laterality Date  . BACK SURGERY         Home Medications    Prior to Admission medications   Medication Sig Start Date End Date Taking? Authorizing Provider  ALPRAZolam Prudy Feeler(XANAX) 1 MG tablet Take 1 mg by mouth 3 (three) times daily as needed.     [provider]  amoxicillin (AMOXIL) 875 MG tablet Take 1 tablet (875 mg total) by mouth 2 (two) times daily. 11/27/17   Lattie HawBeese, Stephen A, MD  amoxicillin-clavulanate (AUGMENTIN) 875-125 MG tablet Take 1 tablet by mouth 2 (two) times daily. One po bid x 7 days 02/24/19   Lurene ShadowPhelps, Shenetta Schnackenberg O, PA-C  escitalopram (LEXAPRO) 20 MG tablet Take 20 mg by mouth daily.    [provider]  gabapentin (NEURONTIN) 300 MG capsule Take 1 capsule (300 mg total) by mouth at bedtime. 06/19/14   Laren BoomHommel, Sean, DO  meloxicam (MOBIC) 15 MG tablet Take 1 tablet (15 mg total)  by mouth daily. Take with food each morning 04/13/15   Lattie HawBeese, Stephen A, MD  neomycin-polymyxin-hydrocortisone (CORTISPORIN) 3.5-10000-1 OTIC suspension Place 4 drops into the right ear 3 (three) times daily. 11/27/17   Lattie HawBeese, Stephen A, MD  ondansetron (ZOFRAN ODT) 4 MG disintegrating tablet Take one tab by mouth Q6hr prn nausea (Dissolve under tongue) 04/03/16   Lattie HawBeese, Stephen A, MD  traZODone (DESYREL) 50 MG tablet Take 50 mg by mouth at bedtime.    [provider]    Family History Family History  Problem Relation Age of Onset  . Rheum arthritis Father   . Dementia Father   . Cancer Mother        breast    Social History Social History   Tobacco Use  . Smoking status: Current Every Day Smoker    Packs/day: 1.50    Years: 25.00    Pack years: 37.50    Types: Cigarettes  . Smokeless tobacco: Never Used  Substance Use Topics  . Alcohol use: No    Alcohol/week: 0.0 standard drinks  . Drug use: No     Allergies   Tramadol   Review of Systems Review of Systems  Constitutional: Negative for chills and fever.  HENT: Positive for congestion and ear pain. Negative for sore throat, trouble swallowing and  voice change.   Respiratory: Negative for cough and shortness of breath.   Cardiovascular: Negative for chest pain and palpitations.  Gastrointestinal: Negative for abdominal pain, diarrhea, nausea and vomiting.  Musculoskeletal: Negative for arthralgias, back pain and myalgias.  Skin: Negative for rash.     Physical Exam Triage Vital Signs ED Triage Vitals [02/24/19 1911]  Enc Vitals Group     BP 126/76     Pulse Rate 71     Resp 18     Temp 97.9 F (36.6 C)     Temp Source Oral     SpO2 98 %     Weight 198 lb (89.8 kg)     Height 5\' 10"  (1.778 m)     Head Circumference      Peak Flow      Pain Score 0     Pain Loc      Pain Edu?      Excl. in GC?    No data found.  Updated Vital Signs BP 126/76 (BP Location: Right Arm)   Pulse 71   Temp 97.9  F (36.6 C) (Oral)   Resp 18   Ht 5\' 10"  (1.778 m)   Wt 198 lb (89.8 kg)   SpO2 98%   BMI 28.41 kg/m   Visual Acuity Right Eye Distance:   Left Eye Distance:   Bilateral Distance:    Right Eye Near:   Left Eye Near:    Bilateral Near:     Physical Exam Vitals signs and nursing note reviewed.  Constitutional:      Appearance: Normal appearance. He is well-developed.  HENT:     Head: Normocephalic and atraumatic.     Right Ear: Tympanic membrane is erythematous. Tympanic membrane is not bulging.     Left Ear: Tympanic membrane normal.     Nose: Nose normal.     Right Sinus: No maxillary sinus tenderness or frontal sinus tenderness.     Left Sinus: No maxillary sinus tenderness or frontal sinus tenderness.     Mouth/Throat:     Lips: Pink.     Mouth: Mucous membranes are moist.     Pharynx: Oropharynx is clear. Uvula midline.  Neck:     Musculoskeletal: Normal range of motion.  Cardiovascular:     Rate and Rhythm: Normal rate and regular rhythm.  Pulmonary:     Effort: Pulmonary effort is normal. No respiratory distress.     Breath sounds: Normal breath sounds. No stridor. No wheezing, rhonchi or rales.  Musculoskeletal: Normal range of motion.  Skin:    General: Skin is warm and dry.  Neurological:     Mental Status: He is alert and oriented to person, place, and time.  Psychiatric:        Behavior: Behavior normal.      UC Treatments / Results  Labs (all labs ordered are listed, but only abnormal results are displayed) Labs Reviewed - No data to display  EKG   Radiology No results found.  Procedures Procedures (including critical care time)  Medications Ordered in UC Medications - No data to display  Initial Impression / Assessment and Plan / UC Course  I have reviewed the triage vital signs and the nursing notes.  Pertinent labs & imaging results that were available during my care of the patient were reviewed by me and considered in my medical  decision making (see chart for details).     Right ear c/w AOM Will tx with Augmentin AVS  provided.  Final Clinical Impressions(s) / UC Diagnoses   Final diagnoses:  Right acute otitis media     Discharge Instructions      Please take antibiotics as prescribed and be sure to complete entire course even if you start to feel better to ensure infection does not come back.  You may take 500mg  acetaminophen every 4-6 hours or in combination with ibuprofen 400-600mg  every 6-8 hours as needed for pain, inflammation, and fever.  Be sure to well hydrated with clear liquids and get at least 8 hours of sleep at night, preferably more while sick.   Please follow up with family medicine in 1 week if needed.     ED Prescriptions    Medication Sig Dispense Auth. Provider   amoxicillin-clavulanate (AUGMENTIN) 875-125 MG tablet Take 1 tablet by mouth 2 (two) times daily. One po bid x 7 days 14 tablet Noe Gens, [place]     PDMP not reviewed this encounter.   Noe Gens, [place] 02/24/19 1947

## 2019-02-24 NOTE — ED Triage Notes (Signed)
Pt c/o RT ear pressure x 1 week. Started to become more painful over the last few days. Feels like its started to occur in LT ear now. Also c/o runny nose. Works around a lot of dust at work. Taking tylenol prn.

## 2019-02-24 NOTE — Discharge Instructions (Signed)

## 2020-06-11 ENCOUNTER — Encounter: Payer: Self-pay | Admitting: Emergency Medicine

## 2020-06-11 ENCOUNTER — Emergency Department
Admission: EM | Admit: 2020-06-11 | Discharge: 2020-06-11 | Disposition: A | Discharge status: Home / Self Care | Payer: PRIVATE HEALTH INSURANCE | Source: Home / Self Care | Attending: Family Medicine | Admitting: Family Medicine

## 2020-06-11 ENCOUNTER — Other Ambulatory Visit: Payer: Self-pay

## 2020-06-11 DIAGNOSIS — R5081 Fever presenting with conditions classified elsewhere: Secondary | ICD-10-CM

## 2020-06-11 MED ORDER — GUAIFENESIN ER 600 MG PO TB12: ORAL_TABLET | ORAL | 0 refills | Status: AC

## 2020-06-11 MED ORDER — DOXYCYCLINE HYCLATE 100 MG PO CAPS: 100.0000 mg | ORAL_CAPSULE | Freq: Two times a day (BID) | ORAL | 0 refills | Status: AC

## 2020-06-11 NOTE — Discharge Instructions (Addendum)
May add Pseudoephedrine (30mg , one or two every 4 to 6 hours) for sinus congestion.  Get adequate rest.   May take Delsym Cough Suppressant ("12 Hour Cough Relief") at bedtime for nighttime cough.  Try warm salt water gargles for sore throat.  Stop all antihistamines for now, and other non-prescription cough/cold preparations. May continue Aleve for headache, body aches, etc.

## 2020-06-11 NOTE — ED Triage Notes (Signed)
Patient here day 3 of aches, headache, chills with fever 102 today; congestion. Took aleve at 1000. Wife also here for evaluation. Has not had covid vaccinations; did have influenza vaccination.

## 2020-06-11 NOTE — ED Provider Notes (Signed)
[facility]    CSN: [id number] Arrival date & time: 06/11/20  1110      History   Chief Complaint Chief Complaint  Patient presents with  . Generalized Body Aches  . Fatigue  . Nasal Congestion  . Fever    HPI Ronald Chavez is a 50 y.o. male.   Patient complains of 3 day history of fatigue, headache, fever to 102 with chills, sinus congestion, cough, and tightness in his anterior chest.  He denies pleuritic pain and shortness of breath.  He has not had COVID19 vaccinations.  The history is provided by the patient.    Past Medical History:  Diagnosis Date  . Anxiety   . Hyperlipidemia   . Hyperlipidemia   . Insomnia   . Reflux gastritis     Patient Active Problem List   Diagnosis Date Noted  . Hyperlipidemia 06/19/2014  . Umbilical hernia without obstruction and without gangrene 06/19/2014  . Lumbar degenerative disc disease 06/13/2014  . Sprain of fifth finger of left hand 06/13/2014  . Skin tag 06/13/2014  . GAD (generalized anxiety disorder) 02/05/2012  . Depressive disorder 02/05/2012    Past Surgical History:  Procedure Laterality Date  . BACK SURGERY         Home Medications    Prior to Admission medications   Medication Sig Start Date End Date Taking? Authorizing Provider  doxycycline (VIBRAMYCIN) 100 MG capsule Take 1 capsule (100 mg total) by mouth 2 (two) times daily. Take with food. 06/11/20  Yes Lattie Haw, MD  guaiFENesin (MUCINEX) 600 MG 12 hr tablet Take 2 tabs PO Q12hr with plenty of fluids 06/11/20  Yes Enaya Howze, Tera Mater, MD  ALPRAZolam Prudy Feeler) 1 MG tablet Take 1 mg by mouth 3 (three) times daily as needed.     [provider]  escitalopram (LEXAPRO) 20 MG tablet Take 20 mg by mouth daily.    [provider]  gabapentin (NEURONTIN) 300 MG capsule Take 1 capsule (300 mg total) by mouth at bedtime. 06/19/14   Laren Boom, DO  traZODone (DESYREL) 50 MG tablet Take 50 mg by mouth at bedtime.    [provider]    Family History Family History  Problem Relation Age of Onset  . Rheum arthritis Father   . Dementia Father   . Cancer Mother        breast    Social History Social History   Tobacco Use  . Smoking status: Current Every Day Smoker    Packs/day: 1.50    Years: 25.00    Pack years: 37.50    Types: Cigarettes  . Smokeless tobacco: Never Used  Vaping Use  . Vaping Use: Never used  Substance Use Topics  . Alcohol use: No    Alcohol/week: 0.0 standard drinks  . Drug use: No     Allergies   Tramadol   Review of Systems Review of Systems  No sore throat + cough No pleuritic pain but feels tight in anterior chest No wheezing + nasal congestion + post-nasal drainage No sinus pain/pressure No itchy/red eyes No earache No hemoptysis No SOB + fever, + chills No nausea No vomiting No abdominal pain No diarrhea No urinary symptoms No skin rash + fatigue No myalgias + headache Used OTC meds (Aleve) without relief    Physical Exam Triage Vital Signs ED Triage Vitals  Enc Vitals Group     BP 06/11/20 1250 127/82     Pulse Rate 06/11/20 1250 93  Resp 06/11/20 1250 18     Temp 06/11/20 1250 98.7 F (37.1 C)     Temp Source 06/11/20 1250 Oral     SpO2 06/11/20 1250 97 %     Weight 06/11/20 1251 204 lb (92.5 kg)     Height 06/11/20 1251 5\' 10"  (1.778 m)     Head Circumference --      Peak Flow --      Pain Score 06/11/20 1251 5     Pain Loc --      Pain Edu? --      Excl. in GC? --    No data found.  Updated Vital Signs BP 127/82 (BP Location: Right Arm)   Pulse 93   Temp 98.7 F (37.1 C) (Oral)   Resp 18   Ht 5\' 10"  (1.778 m)   Wt 92.5 kg   SpO2 97%   BMI 29.27 kg/m   Visual Acuity Right Eye Distance:   Left Eye Distance:   Bilateral Distance:    Right Eye Near:   Left Eye Near:    Bilateral Near:     Physical Exam Nursing notes and Vital Signs reviewed. Appearance:  Patient appears stated age, and in no acute  distress Eyes:  Pupils are equal, round, and reactive to light and accomodation.  Extraocular movement is intact.  Conjunctivae are not inflamed  Ears:  Canals normal.  Tympanic membranes normal.  Nose:  Mildly congested turbinates.  No sinus tenderness.   Pharynx:  Normal Neck:  Supple.  Mildly enlarged lateral nodes are present, tender to palpation on the left.   Lungs:   Faint wheezes right superior posterior chest.  Breath sounds are equal.  Moving air well. Heart:  Regular rate and rhythm without murmurs, rubs, or gallops.  Abdomen:  Nontender without masses or hepatosplenomegaly.  Bowel sounds are present.  No CVA or flank tenderness.  Extremities:  No edema.  Skin:  No rash present.   UC Treatments / Results  Labs (all labs ordered are listed, but only abnormal results are displayed) Labs Reviewed  SARS-COV-2 RNA,(COVID-19) QUALITATIVE NAAT    EKG   Radiology No results found.  Procedures Procedures (including critical care time)  Medications Ordered in UC Medications - No data to display  Initial Impression / Assessment and Plan / UC Course  I have reviewed the triage vital signs and the nursing notes.  Pertinent labs & imaging results that were available during my care of the patient were reviewed by me and considered in my medical decision making (see chart for details).    COVID19 PCR pending.  Patient is a smoker; begin empiric doxycycline. Followup with Family Doctor if not improved in one week.    Final Clinical Impressions(s) / UC Diagnoses   Final diagnoses:  Fever in other diseases     Discharge Instructions     May add Pseudoephedrine (30mg , one or two every 4 to 6 hours) for sinus congestion.  Get adequate rest.   May take Delsym Cough Suppressant ("12 Hour Cough Relief") at bedtime for nighttime cough.  Try warm salt water gargles for sore throat.  Stop all antihistamines for now, and other non-prescription cough/cold preparations. May continue  Aleve for headache, body aches, etc.      ED Prescriptions    Medication Sig Dispense Auth. Provider   doxycycline (VIBRAMYCIN) 100 MG capsule Take 1 capsule (100 mg total) by mouth 2 (two) times daily. Take with food. 20 capsule 06/13/20,  MD   guaiFENesin (MUCINEX) 600 MG 12 hr tablet Take 2 tabs PO Q12hr with plenty of fluids 40 tablet Lattie Haw, MD        Lattie Haw, MD 06/13/20 1026

## 2020-06-13 LAB — SARS-COV-2 RNA,(COVID-19) QUALITATIVE NAAT: SARS CoV2 RNA: DETECTED — CR
